# Patient Record
Sex: Female | Born: 1980 | Race: White | Hispanic: No | Marital: Married | Smoking: Never smoker
Health system: Southern US, Community
[De-identification: ages and names within clinical notes are randomized; demographics above are authoritative.]

## PROBLEM LIST (undated history)

## (undated) DIAGNOSIS — T4145XA Adverse effect of unspecified anesthetic, initial encounter: Secondary | ICD-10-CM

## (undated) DIAGNOSIS — T8859XA Other complications of anesthesia, initial encounter: Secondary | ICD-10-CM

## (undated) DIAGNOSIS — T7840XA Allergy, unspecified, initial encounter: Secondary | ICD-10-CM

## (undated) DIAGNOSIS — O4403 Placenta previa specified as without hemorrhage, third trimester: Secondary | ICD-10-CM

## (undated) DIAGNOSIS — R569 Unspecified convulsions: Secondary | ICD-10-CM

## (undated) DIAGNOSIS — Z98891 History of uterine scar from previous surgery: Secondary | ICD-10-CM

## (undated) DIAGNOSIS — O09519 Supervision of elderly primigravida, unspecified trimester: Secondary | ICD-10-CM

## (undated) DIAGNOSIS — R51 Headache: Secondary | ICD-10-CM

## (undated) DIAGNOSIS — R519 Headache, unspecified: Secondary | ICD-10-CM

## (undated) DIAGNOSIS — O403XX0 Polyhydramnios, third trimester, not applicable or unspecified: Secondary | ICD-10-CM

## (undated) DIAGNOSIS — L309 Dermatitis, unspecified: Secondary | ICD-10-CM

## (undated) HISTORY — DX: Other complications of anesthesia, initial encounter: T88.59XA

## (undated) HISTORY — PX: WISDOM TOOTH EXTRACTION: SHX21

## (undated) HISTORY — DX: Polyhydramnios, third trimester, not applicable or unspecified: O40.3XX0

## (undated) HISTORY — DX: Headache: R51

## (undated) HISTORY — DX: Headache, unspecified: R51.9

## (undated) HISTORY — DX: Allergy, unspecified, initial encounter: T78.40XA

## (undated) HISTORY — PX: CHOLECYSTECTOMY: SHX55

## (undated) HISTORY — DX: Dermatitis, unspecified: L30.9

## (undated) HISTORY — DX: Supervision of elderly primigravida, unspecified trimester: O09.519

## (undated) HISTORY — DX: Adverse effect of unspecified anesthetic, initial encounter: T41.45XA

---

## 2014-08-24 ENCOUNTER — Encounter: Payer: Self-pay | Admitting: Internal Medicine

## 2014-08-24 ENCOUNTER — Ambulatory Visit (INDEPENDENT_AMBULATORY_CARE_PROVIDER_SITE_OTHER): Payer: Commercial Managed Care - PPO | Admitting: Internal Medicine

## 2014-08-24 VITALS — Temp 98.0°F | Ht 68.0 in | Wt 138.0 lb

## 2014-08-24 DIAGNOSIS — L309 Dermatitis, unspecified: Secondary | ICD-10-CM | POA: Insufficient documentation

## 2014-08-24 DIAGNOSIS — B349 Viral infection, unspecified: Secondary | ICD-10-CM | POA: Insufficient documentation

## 2014-08-24 DIAGNOSIS — G40909 Epilepsy, unspecified, not intractable, without status epilepticus: Secondary | ICD-10-CM | POA: Insufficient documentation

## 2014-08-24 NOTE — Assessment & Plan Note (Addendum)
I discussed the different possible etiologies of this. Syphilis, HIV and [place] spotted fever certainly high however all those have been essentially negative. Other viruses certainly could cause this and leave a rash and persistent symptoms particularly with the sore throat, myalgias and arthralgias. Other possibility could be toxic shock but this would be a very mild case and though she was using tampons she has used one since and has had no issues with that.  Also strep or staph-related illness but is resolving regardless. I told her that it would be difficult to find any definitive diagnosis however is all she continues to get better no reason for further workup. I suspect her symptoms will persist for several weeks to months but should slowly get better. I did encourage her to maintain her activity to help facilitate improvement. I also suggested a multivitamin and protein for improvement. She was told to call if she has any different symptoms or concerns but otherwise can follow-up on a when necessary basis.  45 minutes spent with patient including 25 minutes of face-to-face contact of counseling and exam.

## 2014-08-24 NOTE — Progress Notes (Signed)
Subjective:    Patient ID: Amber Taylor, female    DOB: [birth year], 34 y.o.   MRN: [id number]  HPI She comes in here for evaluation as a new patient. She was noted to have a rash on her hands and soles. She is me history of starting symptoms about February of this year. She initially noted a sore throat and some chills and body aches and then developed the rash on her palms and soles of her feet several days later. She also noted some facial swelling and hives all along her body. She had been hiking in February but no known tick exposure. She was seen in the urgent care and thought to be hand-foot-and-mouth disease. Then she was seen in another urgent care and there is concern for Stevens-Johnson's and she was sent to an emergency room. She was managed supportively and thought it could be due to her Lamictal and that was stopped. She then began to develop some sloughing of her skin on her hands and feet. She did also have a seizure after being off her Lamictal and now is on Ativan. Her rash persisted and she was [facility] and other viruses. [facility] was notable for a positive IgG but negative IgM. Coxsackie was negative for IgM. She also tells me she was tested for syphilis and HIV which both were negative, though I don't have a copy of the report here. Other labs show a relatively normal WBC and hemoglobin. No electrolyte abnormalities. Her ALT was minimally elevated and TSH was normal. She also has some swelling of her hands and some joint aches and swelling of her hands. This did resolve. She also gives a history of tampon use and did have a tampon in when symptoms did start. She though recently has used a tampon again during her recent menstruation is had no problem with that. Her symptoms seem to be resolving though still has some sensitive skin on her hands after the her layer peeled off.  She still has persistent fatigue though seems to be slowly  improving.   She did have all of her childhood vaccines. She did have pictures to show me the rash at different stages.   Review of Systems  Constitutional: Positive for appetite change and fatigue. Negative for fever, chills and unexpected weight change.  HENT: Negative for sore throat and trouble swallowing.   Gastrointestinal: Negative for nausea, abdominal pain and diarrhea.  Musculoskeletal: Positive for myalgias and arthralgias.       Symptoms have nearly resolved but some persistent symptoms  Skin:       Rashes nearly resolved  Neurological: Positive for headaches. Negative for dizziness and light-headedness.       She did have some headaches during the time but has resolved  Psychiatric/Behavioral: The patient is not nervous/anxious.        Objective:   Physical Exam  Constitutional: She appears well-developed and well-nourished. No distress.  HENT:  Mouth/Throat: No oropharyngeal exudate.  Eyes: Right eye exhibits no discharge. Left eye exhibits no discharge. No scleral icterus.  Cardiovascular: Normal rate, regular rhythm and normal heart sounds.   No murmur heard. Pulmonary/Chest: Effort normal and breath sounds normal. No respiratory distress. She has no wheezes.  Lymphadenopathy:    She has no cervical adenopathy.  Skin:  Faint rash on her arms and resolving rash on her hands  Psychiatric: She has a normal mood and affect.  Assessment & Plan:

## 2015-03-25 ENCOUNTER — Ambulatory Visit (INDEPENDENT_AMBULATORY_CARE_PROVIDER_SITE_OTHER): Payer: BLUE CROSS/BLUE SHIELD | Admitting: Neurology

## 2015-03-25 DIAGNOSIS — J309 Allergic rhinitis, unspecified: Secondary | ICD-10-CM | POA: Diagnosis not present

## 2015-03-29 ENCOUNTER — Ambulatory Visit (INDEPENDENT_AMBULATORY_CARE_PROVIDER_SITE_OTHER): Payer: BLUE CROSS/BLUE SHIELD | Admitting: *Deleted

## 2015-03-29 DIAGNOSIS — J309 Allergic rhinitis, unspecified: Secondary | ICD-10-CM | POA: Diagnosis not present

## 2015-03-31 ENCOUNTER — Ambulatory Visit (INDEPENDENT_AMBULATORY_CARE_PROVIDER_SITE_OTHER): Payer: BLUE CROSS/BLUE SHIELD | Admitting: Neurology

## 2015-03-31 DIAGNOSIS — J309 Allergic rhinitis, unspecified: Secondary | ICD-10-CM | POA: Diagnosis not present

## 2015-04-08 ENCOUNTER — Ambulatory Visit (INDEPENDENT_AMBULATORY_CARE_PROVIDER_SITE_OTHER): Payer: BLUE CROSS/BLUE SHIELD

## 2015-04-08 DIAGNOSIS — J301 Allergic rhinitis due to pollen: Secondary | ICD-10-CM | POA: Diagnosis not present

## 2015-04-13 ENCOUNTER — Ambulatory Visit (INDEPENDENT_AMBULATORY_CARE_PROVIDER_SITE_OTHER): Payer: BLUE CROSS/BLUE SHIELD | Admitting: Neurology

## 2015-04-13 DIAGNOSIS — J309 Allergic rhinitis, unspecified: Secondary | ICD-10-CM | POA: Diagnosis not present

## 2015-04-19 ENCOUNTER — Ambulatory Visit (INDEPENDENT_AMBULATORY_CARE_PROVIDER_SITE_OTHER): Payer: BLUE CROSS/BLUE SHIELD | Admitting: *Deleted

## 2015-04-19 DIAGNOSIS — J309 Allergic rhinitis, unspecified: Secondary | ICD-10-CM | POA: Diagnosis not present

## 2015-04-25 ENCOUNTER — Ambulatory Visit (INDEPENDENT_AMBULATORY_CARE_PROVIDER_SITE_OTHER): Payer: BLUE CROSS/BLUE SHIELD

## 2015-04-25 DIAGNOSIS — J309 Allergic rhinitis, unspecified: Secondary | ICD-10-CM | POA: Diagnosis not present

## 2015-04-28 ENCOUNTER — Ambulatory Visit (INDEPENDENT_AMBULATORY_CARE_PROVIDER_SITE_OTHER): Payer: BLUE CROSS/BLUE SHIELD | Admitting: *Deleted

## 2015-04-28 DIAGNOSIS — J309 Allergic rhinitis, unspecified: Secondary | ICD-10-CM | POA: Diagnosis not present

## 2015-05-02 ENCOUNTER — Ambulatory Visit (INDEPENDENT_AMBULATORY_CARE_PROVIDER_SITE_OTHER): Payer: BLUE CROSS/BLUE SHIELD

## 2015-05-02 DIAGNOSIS — J309 Allergic rhinitis, unspecified: Secondary | ICD-10-CM | POA: Diagnosis not present

## 2015-05-04 ENCOUNTER — Ambulatory Visit (INDEPENDENT_AMBULATORY_CARE_PROVIDER_SITE_OTHER): Payer: BLUE CROSS/BLUE SHIELD

## 2015-05-04 DIAGNOSIS — J309 Allergic rhinitis, unspecified: Secondary | ICD-10-CM | POA: Diagnosis not present

## 2015-05-09 ENCOUNTER — Ambulatory Visit (INDEPENDENT_AMBULATORY_CARE_PROVIDER_SITE_OTHER): Payer: BLUE CROSS/BLUE SHIELD

## 2015-05-09 DIAGNOSIS — J309 Allergic rhinitis, unspecified: Secondary | ICD-10-CM | POA: Diagnosis not present

## 2015-05-11 ENCOUNTER — Ambulatory Visit (INDEPENDENT_AMBULATORY_CARE_PROVIDER_SITE_OTHER): Payer: BLUE CROSS/BLUE SHIELD | Admitting: *Deleted

## 2015-05-11 ENCOUNTER — Ambulatory Visit: Payer: Self-pay | Admitting: *Deleted

## 2015-05-11 DIAGNOSIS — J309 Allergic rhinitis, unspecified: Secondary | ICD-10-CM | POA: Diagnosis not present

## 2015-05-17 ENCOUNTER — Ambulatory Visit (INDEPENDENT_AMBULATORY_CARE_PROVIDER_SITE_OTHER): Payer: BLUE CROSS/BLUE SHIELD | Admitting: *Deleted

## 2015-05-17 DIAGNOSIS — J309 Allergic rhinitis, unspecified: Secondary | ICD-10-CM

## 2015-05-19 ENCOUNTER — Ambulatory Visit (INDEPENDENT_AMBULATORY_CARE_PROVIDER_SITE_OTHER): Payer: BLUE CROSS/BLUE SHIELD | Admitting: *Deleted

## 2015-05-19 DIAGNOSIS — J309 Allergic rhinitis, unspecified: Secondary | ICD-10-CM | POA: Diagnosis not present

## 2015-05-23 ENCOUNTER — Ambulatory Visit (INDEPENDENT_AMBULATORY_CARE_PROVIDER_SITE_OTHER): Payer: BLUE CROSS/BLUE SHIELD

## 2015-05-23 DIAGNOSIS — J309 Allergic rhinitis, unspecified: Secondary | ICD-10-CM | POA: Diagnosis not present

## 2015-05-30 ENCOUNTER — Ambulatory Visit (INDEPENDENT_AMBULATORY_CARE_PROVIDER_SITE_OTHER): Payer: BLUE CROSS/BLUE SHIELD

## 2015-05-30 DIAGNOSIS — J309 Allergic rhinitis, unspecified: Secondary | ICD-10-CM | POA: Diagnosis not present

## 2015-06-02 ENCOUNTER — Ambulatory Visit (INDEPENDENT_AMBULATORY_CARE_PROVIDER_SITE_OTHER): Payer: BLUE CROSS/BLUE SHIELD

## 2015-06-02 DIAGNOSIS — J309 Allergic rhinitis, unspecified: Secondary | ICD-10-CM

## 2015-06-07 ENCOUNTER — Ambulatory Visit (INDEPENDENT_AMBULATORY_CARE_PROVIDER_SITE_OTHER): Payer: BLUE CROSS/BLUE SHIELD

## 2015-06-07 DIAGNOSIS — J309 Allergic rhinitis, unspecified: Secondary | ICD-10-CM | POA: Diagnosis not present

## 2015-06-09 ENCOUNTER — Ambulatory Visit (INDEPENDENT_AMBULATORY_CARE_PROVIDER_SITE_OTHER): Payer: BLUE CROSS/BLUE SHIELD

## 2015-06-09 DIAGNOSIS — J309 Allergic rhinitis, unspecified: Secondary | ICD-10-CM | POA: Diagnosis not present

## 2015-06-12 NOTE — L&D Delivery Note (Addendum)
Delivery Note  Pt pushed for just under an hour and at 8:54 AM a viable female was delivered via Vaginal, Spontaneous Delivery (Presentation:OA ;  ).  APGAR: 8, 9; weight; pending  .   Placenta status:delivered shultz, intact , .  Cord:  with the following complications:none; nuchal x 1 reduced easily over infants head  Anesthesia:  epidural Episiotomy: None Lacerations: 2nd degree;Perineal Suture Repair: 2.0 vicryl rapide and 0 Est. Blood Loss (mL): 150  Mom to postpartum.  Baby to Couplet care / Skin to Skin.  Amber Taylor 04/28/2016, 9:29 AM

## 2015-06-14 ENCOUNTER — Ambulatory Visit (INDEPENDENT_AMBULATORY_CARE_PROVIDER_SITE_OTHER): Payer: BLUE CROSS/BLUE SHIELD

## 2015-06-14 DIAGNOSIS — J309 Allergic rhinitis, unspecified: Secondary | ICD-10-CM

## 2015-06-21 ENCOUNTER — Ambulatory Visit (INDEPENDENT_AMBULATORY_CARE_PROVIDER_SITE_OTHER): Payer: BLUE CROSS/BLUE SHIELD

## 2015-06-21 DIAGNOSIS — J309 Allergic rhinitis, unspecified: Secondary | ICD-10-CM

## 2015-07-11 ENCOUNTER — Ambulatory Visit (INDEPENDENT_AMBULATORY_CARE_PROVIDER_SITE_OTHER): Payer: BLUE CROSS/BLUE SHIELD

## 2015-07-11 DIAGNOSIS — J309 Allergic rhinitis, unspecified: Secondary | ICD-10-CM

## 2015-07-18 ENCOUNTER — Ambulatory Visit (INDEPENDENT_AMBULATORY_CARE_PROVIDER_SITE_OTHER): Payer: BLUE CROSS/BLUE SHIELD

## 2015-07-18 DIAGNOSIS — J309 Allergic rhinitis, unspecified: Secondary | ICD-10-CM

## 2015-07-27 ENCOUNTER — Ambulatory Visit (INDEPENDENT_AMBULATORY_CARE_PROVIDER_SITE_OTHER): Payer: BLUE CROSS/BLUE SHIELD

## 2015-07-27 DIAGNOSIS — J309 Allergic rhinitis, unspecified: Secondary | ICD-10-CM | POA: Diagnosis not present

## 2015-08-02 ENCOUNTER — Ambulatory Visit (INDEPENDENT_AMBULATORY_CARE_PROVIDER_SITE_OTHER): Payer: BLUE CROSS/BLUE SHIELD

## 2015-08-02 DIAGNOSIS — J309 Allergic rhinitis, unspecified: Secondary | ICD-10-CM

## 2015-08-10 DIAGNOSIS — J301 Allergic rhinitis due to pollen: Secondary | ICD-10-CM | POA: Diagnosis not present

## 2015-08-11 DIAGNOSIS — J3089 Other allergic rhinitis: Secondary | ICD-10-CM | POA: Diagnosis not present

## 2015-08-12 ENCOUNTER — Ambulatory Visit (INDEPENDENT_AMBULATORY_CARE_PROVIDER_SITE_OTHER): Payer: BLUE CROSS/BLUE SHIELD | Admitting: *Deleted

## 2015-08-12 DIAGNOSIS — J309 Allergic rhinitis, unspecified: Secondary | ICD-10-CM

## 2015-08-18 ENCOUNTER — Ambulatory Visit (INDEPENDENT_AMBULATORY_CARE_PROVIDER_SITE_OTHER): Payer: BLUE CROSS/BLUE SHIELD

## 2015-08-18 DIAGNOSIS — J309 Allergic rhinitis, unspecified: Secondary | ICD-10-CM

## 2015-08-23 ENCOUNTER — Ambulatory Visit (INDEPENDENT_AMBULATORY_CARE_PROVIDER_SITE_OTHER): Payer: BLUE CROSS/BLUE SHIELD

## 2015-08-23 DIAGNOSIS — J309 Allergic rhinitis, unspecified: Secondary | ICD-10-CM

## 2015-08-30 ENCOUNTER — Ambulatory Visit (INDEPENDENT_AMBULATORY_CARE_PROVIDER_SITE_OTHER): Payer: BLUE CROSS/BLUE SHIELD

## 2015-08-30 DIAGNOSIS — J309 Allergic rhinitis, unspecified: Secondary | ICD-10-CM

## 2015-09-13 ENCOUNTER — Telehealth: Payer: Self-pay | Admitting: *Deleted

## 2015-09-13 NOTE — Telephone Encounter (Signed)
Patient called and states she just found out she is pregnant and wants to know the plan with allergy injections. She is currently at Red #1 1:100. Her Grass-Tree last dose was 0.50, Cat-Dog - 0.50, Mite-Weed 0.40. Please advise.

## 2015-09-20 ENCOUNTER — Ambulatory Visit (INDEPENDENT_AMBULATORY_CARE_PROVIDER_SITE_OTHER): Payer: BLUE CROSS/BLUE SHIELD | Admitting: *Deleted

## 2015-09-20 DIAGNOSIS — J309 Allergic rhinitis, unspecified: Secondary | ICD-10-CM | POA: Diagnosis not present

## 2015-10-05 ENCOUNTER — Ambulatory Visit (INDEPENDENT_AMBULATORY_CARE_PROVIDER_SITE_OTHER): Payer: BLUE CROSS/BLUE SHIELD

## 2015-10-05 DIAGNOSIS — J309 Allergic rhinitis, unspecified: Secondary | ICD-10-CM

## 2015-10-12 ENCOUNTER — Ambulatory Visit (INDEPENDENT_AMBULATORY_CARE_PROVIDER_SITE_OTHER): Payer: BLUE CROSS/BLUE SHIELD | Admitting: *Deleted

## 2015-10-12 DIAGNOSIS — J309 Allergic rhinitis, unspecified: Secondary | ICD-10-CM

## 2015-10-13 LAB — OB RESULTS CONSOLE ABO/RH: RH Type: NEGATIVE

## 2015-10-13 LAB — OB RESULTS CONSOLE HEPATITIS B SURFACE ANTIGEN: Hepatitis B Surface Ag: NEGATIVE

## 2015-10-13 LAB — OB RESULTS CONSOLE RUBELLA ANTIBODY, IGM: Rubella: IMMUNE

## 2015-10-13 LAB — OB RESULTS CONSOLE HIV ANTIBODY (ROUTINE TESTING): HIV: NONREACTIVE

## 2015-10-13 LAB — OB RESULTS CONSOLE ANTIBODY SCREEN: Antibody Screen: NEGATIVE

## 2015-10-13 LAB — OB RESULTS CONSOLE GC/CHLAMYDIA
Chlamydia: NEGATIVE
GC PROBE AMP, GENITAL: NEGATIVE

## 2015-10-13 LAB — OB RESULTS CONSOLE RPR: RPR: NONREACTIVE

## 2015-11-01 ENCOUNTER — Ambulatory Visit (INDEPENDENT_AMBULATORY_CARE_PROVIDER_SITE_OTHER): Payer: BLUE CROSS/BLUE SHIELD

## 2015-11-01 DIAGNOSIS — J309 Allergic rhinitis, unspecified: Secondary | ICD-10-CM

## 2015-11-15 ENCOUNTER — Ambulatory Visit (INDEPENDENT_AMBULATORY_CARE_PROVIDER_SITE_OTHER): Payer: BLUE CROSS/BLUE SHIELD | Admitting: *Deleted

## 2015-11-15 DIAGNOSIS — J309 Allergic rhinitis, unspecified: Secondary | ICD-10-CM | POA: Diagnosis not present

## 2015-11-22 ENCOUNTER — Ambulatory Visit (INDEPENDENT_AMBULATORY_CARE_PROVIDER_SITE_OTHER): Payer: BLUE CROSS/BLUE SHIELD

## 2015-11-22 DIAGNOSIS — J309 Allergic rhinitis, unspecified: Secondary | ICD-10-CM | POA: Diagnosis not present

## 2015-12-06 ENCOUNTER — Ambulatory Visit (INDEPENDENT_AMBULATORY_CARE_PROVIDER_SITE_OTHER): Payer: BLUE CROSS/BLUE SHIELD | Admitting: *Deleted

## 2015-12-06 DIAGNOSIS — J309 Allergic rhinitis, unspecified: Secondary | ICD-10-CM

## 2015-12-20 ENCOUNTER — Encounter (HOSPITAL_COMMUNITY): Payer: Self-pay | Admitting: Obstetrics and Gynecology

## 2015-12-21 ENCOUNTER — Ambulatory Visit (INDEPENDENT_AMBULATORY_CARE_PROVIDER_SITE_OTHER): Payer: BLUE CROSS/BLUE SHIELD

## 2015-12-21 ENCOUNTER — Encounter (HOSPITAL_COMMUNITY): Payer: Self-pay | Admitting: Obstetrics and Gynecology

## 2015-12-21 DIAGNOSIS — J309 Allergic rhinitis, unspecified: Secondary | ICD-10-CM | POA: Diagnosis not present

## 2015-12-28 ENCOUNTER — Other Ambulatory Visit (HOSPITAL_COMMUNITY): Payer: Self-pay | Admitting: Obstetrics and Gynecology

## 2015-12-28 DIAGNOSIS — O359XX0 Maternal care for (suspected) fetal abnormality and damage, unspecified, not applicable or unspecified: Secondary | ICD-10-CM

## 2015-12-29 ENCOUNTER — Ambulatory Visit (INDEPENDENT_AMBULATORY_CARE_PROVIDER_SITE_OTHER): Payer: BLUE CROSS/BLUE SHIELD

## 2015-12-29 DIAGNOSIS — J309 Allergic rhinitis, unspecified: Secondary | ICD-10-CM | POA: Diagnosis not present

## 2015-12-30 ENCOUNTER — Ambulatory Visit (HOSPITAL_COMMUNITY)
Admission: RE | Admit: 2015-12-30 | Discharge: 2015-12-30 | Disposition: A | Discharge status: Home / Self Care | Payer: BLUE CROSS/BLUE SHIELD | Source: Ambulatory Visit | Attending: Obstetrics and Gynecology | Admitting: Obstetrics and Gynecology

## 2015-12-30 ENCOUNTER — Encounter (HOSPITAL_COMMUNITY): Payer: Self-pay

## 2015-12-30 DIAGNOSIS — Z3A20 20 weeks gestation of pregnancy: Secondary | ICD-10-CM | POA: Insufficient documentation

## 2015-12-30 DIAGNOSIS — O09519 Supervision of elderly primigravida, unspecified trimester: Secondary | ICD-10-CM

## 2015-12-30 DIAGNOSIS — O358XX0 Maternal care for other (suspected) fetal abnormality and damage, not applicable or unspecified: Secondary | ICD-10-CM | POA: Insufficient documentation

## 2015-12-30 DIAGNOSIS — O99352 Diseases of the nervous system complicating pregnancy, second trimester: Secondary | ICD-10-CM | POA: Insufficient documentation

## 2015-12-30 DIAGNOSIS — G40909 Epilepsy, unspecified, not intractable, without status epilepticus: Secondary | ICD-10-CM | POA: Diagnosis not present

## 2015-12-30 DIAGNOSIS — Z36 Encounter for antenatal screening of mother: Secondary | ICD-10-CM | POA: Insufficient documentation

## 2015-12-30 DIAGNOSIS — O09512 Supervision of elderly primigravida, second trimester: Secondary | ICD-10-CM | POA: Diagnosis not present

## 2015-12-30 DIAGNOSIS — O359XX0 Maternal care for (suspected) fetal abnormality and damage, unspecified, not applicable or unspecified: Secondary | ICD-10-CM

## 2015-12-30 HISTORY — DX: Unspecified convulsions: R56.9

## 2016-01-02 ENCOUNTER — Encounter (HOSPITAL_COMMUNITY): Payer: Self-pay

## 2016-01-03 DIAGNOSIS — O09519 Supervision of elderly primigravida, unspecified trimester: Secondary | ICD-10-CM | POA: Insufficient documentation

## 2016-01-03 NOTE — Progress Notes (Signed)
Genetic Counseling  High-Risk Gestation Note  Appointment Date:  12/30/15 Referred By: Edwinna Areola, * Date of Birth:  May 03, 1981 Partner:  Ignatius Specking   Pregnancy History: G1P0 Estimated Date of Delivery: 05/17/16 Estimated Gestational Age: [redacted]w[redacted]d Attending: Particia Nearing, MD   Amber Taylor and her husband, Mr. Dale Strausser, were seen for genetic counseling because of a maternal age of 35 y.o. and ultrasound finding of choroid plexus cyst.     In summary:  Discussed AMA and associated risk for fetal aneuploidy  Choroid plexus cyst on ultrasound today; complete results reported separately  Reviewed approximate 1 in 126 risk for Trisomy 20 given patient's age related risk  Discussed options for screening  NIPS-declined  Ultrasound-performed today  Discussed diagnostic testing options  Amniocentesis-declined  Reviewed family history concerns- patient has personal history of epilepsy  Discussed carrier screening options  CF-declined  SMA-declined  Hemoglobinopathies-declined  They were counseled regarding maternal age and the association with risk for chromosome conditions due to nondisjunction with aging of the ova.   We reviewed chromosomes, nondisjunction, and the associated 1 in 141 risk for fetal aneuploidy related to a maternal age of 35 y.o. at [redacted]w[redacted]d gestation. They were counseled that the risk for aneuploidy decreases as gestational age increases, accounting for those pregnancies which spontaneously abort.  We specifically discussed Down syndrome (trisomy 38), trisomies 51 and 27, and sex chromosome aneuploidies (47,XXX and 47,XXY) including the common features and prognoses of each.   Ultrasound performed today visualized choroid plexus cyst (CPC).  The ultrasound report will be sent under separate cover.  We discussed that the second trimester genetic sonogram is targeted at identifying features associated with aneuploidy.  It has evolved  as a screening tool used to provide an individualized risk assessment for Down syndrome and other trisomies.  The ability of sonography to aid in the detection of aneuploidies relies on identification of both major structural anomalies and "soft markers."  The patient was counseled that the latter term refers to findings that are often normal variants and do not cause any significant medical problems.  Nonetheless, these markers have a known association with aneuploidy.    They were counseled that the choroid plexus is an area in the brain where cerebral spinal fluid, the fluid that bathes the brain and spinal cord, is made.  Cysts, or fluid filled sacs, are sometimes found in the choroid plexus of babies both before and after they are born.  We discussed that approximately 1% of pregnancies evaluated by ultrasound will show choroid plexus cyst (CPCs).  Literature suggests that CPCs are an ultrasound finding in approximately 30-50% of fetuses with trisomy 18, but are an isolated finding in less than 10% of fetuses with trisomy 25.  Ms. Cuervo was counseled that when a patient has other risk factors for fetal trisomy 79 (abnormal First trimester or quad screening, advanced maternal age, or another ultrasound finding), CPCs are associated with an increased risk (LR of 9) for trisomy 63.  Newer literature suggests that in the absence of other risk factors, CPCs are likely a normal variation of development or a benign finding.  CPCs are not associated with an increased risk for fetal Down syndrome. Thus, given the patient's age and the ultrasound finding, the chance for fetal trisomy 97 would be approximately 1 in 68.   We reviewed additional available screening options including noninvasive prenatal screening (NIPS)/cell free DNA (cfDNA) screening. They were counseled that screening tests are used to modify a patient's  a priori risk for aneuploidy, typically based on age. This estimate provides a pregnancy  specific risk assessment. We reviewed the benefits and limitations of each option. Specifically, we discussed the conditions for which each test screens, the detection rates, and false positive rates of each. She was/They were also counseled regarding diagnostic testing via amniocentesis. We reviewed the approximate 1 in 300-500 risk for complications from amniocentesis, including spontaneous pregnancy loss. We discussed the possible results that the tests might provide including: positive, negative, unanticipated, and no result. Finally, they were counseled regarding the cost of each option and potential out of pocket expenses. After consideration of all the options, they declined NIPS and amniocentesis.  They understand that screening tests cannot rule out all birth defects or genetic syndromes. The patient was advised of this limitation and states she still does not want additional testing at this time.   Mrs. Abria Bronaugh was provided with written information regarding cystic fibrosis (CF), spinal muscular atrophy (SMA) and hemoglobinopathies including the carrier frequency, availability of carrier screening and prenatal diagnosis if indicated.  In addition, we discussed that CF and hemoglobinopathies are routinely screened for as part of the [place] newborn screening panel.  After further discussion, she declined screening for CF, SMA and hemoglobinopathies.  Both family histories were reviewed and found to be contributory for epilepsy for the patient of unknown etiology. The patient reported that she is taking lamictal for treatment during pregnancy and has had close follow-up with her neurologist throughout the pregnancy. Epilepsy occurs in approximately 1% of the population and can have many causes.  Approximately 80% of epilepsy is thought to be idiopathic while the remaining 20% is secondary to a variety of factors such as perinatal events, infections, trauma and genetic disease.  A specific diagnosis in  an affected individual is necessary to accurately assess the risk for other family members to develop epilepsy.  In the absence of a known etiology, epilepsy is thought to be caused by a combination of genetic and environmental factors, called multifactorial inheritance. Recurrence risk for epilepsy is estimated to be 4% for offspring of an individual with primary idiopathic epilepsy. It would be important for the couple's pediatrician to be aware of this history so that their child(ren) can be screened and followed appropriately. Without further information regarding the provided family history, an accurate genetic risk cannot be calculated. Further genetic counseling is warranted if more information is obtained.  Available data regarding prenatal use of Lamictal have not indicated an association with an increased risk for birth defects. Conflicting reports exist on whether or not its use is associated with an increased risk for oral clefting. Further studies are needed to elucidate potential associations with prenatal use of Lamictal.  Even though a limited number of medicines are known to cause birth defects, we cannot say that it is completely safe to use any medicines during pregnancy.  It is also not possible to predict any drug-drug interactions that occur, or how they might affect a pregnancy.  The use of medications is recommended in pregnancy only if the benefit to the mother (and thus the pregnancy) outweighs potential risks to the baby.  Sometimes the maternal use of medications, dictated by a medical condition, may even be more beneficial to a pregnancy than not taking the medication(s) at all. Mrs. Mykiah Maung denied exposure to environmental toxins or chemical agents. She denied the use of alcohol, tobacco or street drugs. She denied significant viral illnesses during the course of her pregnancy.  Her medical and surgical histories were otherwise noncontributory.   I counseled this couple  regarding the above risks and available options. Most of the counseling was provided by Janit Pagan, [employer] genetic counseling student, under my direct supervision. The approximate face-to-face time with the genetic counselor was 35 minutes.  Quinn Plowman, MS,  Certified [employer] 01/03/2016

## 2016-01-05 ENCOUNTER — Ambulatory Visit (INDEPENDENT_AMBULATORY_CARE_PROVIDER_SITE_OTHER): Payer: BLUE CROSS/BLUE SHIELD

## 2016-01-05 DIAGNOSIS — J309 Allergic rhinitis, unspecified: Secondary | ICD-10-CM | POA: Diagnosis not present

## 2016-01-06 ENCOUNTER — Encounter (HOSPITAL_COMMUNITY): Payer: Self-pay

## 2016-01-06 ENCOUNTER — Other Ambulatory Visit (HOSPITAL_COMMUNITY): Payer: Self-pay

## 2016-01-11 ENCOUNTER — Ambulatory Visit (INDEPENDENT_AMBULATORY_CARE_PROVIDER_SITE_OTHER): Payer: BLUE CROSS/BLUE SHIELD

## 2016-01-11 DIAGNOSIS — J309 Allergic rhinitis, unspecified: Secondary | ICD-10-CM | POA: Diagnosis not present

## 2016-02-07 ENCOUNTER — Ambulatory Visit (INDEPENDENT_AMBULATORY_CARE_PROVIDER_SITE_OTHER): Payer: BLUE CROSS/BLUE SHIELD | Admitting: *Deleted

## 2016-02-07 DIAGNOSIS — J309 Allergic rhinitis, unspecified: Secondary | ICD-10-CM | POA: Diagnosis not present

## 2016-02-21 ENCOUNTER — Ambulatory Visit (INDEPENDENT_AMBULATORY_CARE_PROVIDER_SITE_OTHER): Payer: BLUE CROSS/BLUE SHIELD | Admitting: *Deleted

## 2016-02-21 DIAGNOSIS — J309 Allergic rhinitis, unspecified: Secondary | ICD-10-CM | POA: Diagnosis not present

## 2016-02-28 ENCOUNTER — Ambulatory Visit (INDEPENDENT_AMBULATORY_CARE_PROVIDER_SITE_OTHER): Payer: BLUE CROSS/BLUE SHIELD

## 2016-02-28 DIAGNOSIS — J309 Allergic rhinitis, unspecified: Secondary | ICD-10-CM

## 2016-04-13 ENCOUNTER — Telehealth (HOSPITAL_COMMUNITY): Payer: Self-pay | Admitting: *Deleted

## 2016-04-13 ENCOUNTER — Encounter (HOSPITAL_COMMUNITY): Payer: Self-pay | Admitting: *Deleted

## 2016-04-13 NOTE — Telephone Encounter (Signed)
Preadmission screen  

## 2016-04-16 LAB — OB RESULTS CONSOLE GBS: STREP GROUP B AG: NEGATIVE

## 2016-04-20 DIAGNOSIS — O403XX0 Polyhydramnios, third trimester, not applicable or unspecified: Secondary | ICD-10-CM

## 2016-04-20 HISTORY — DX: Polyhydramnios, third trimester, not applicable or unspecified: O40.3XX0

## 2016-04-27 ENCOUNTER — Encounter (HOSPITAL_COMMUNITY): Payer: Self-pay

## 2016-04-27 ENCOUNTER — Inpatient Hospital Stay (HOSPITAL_COMMUNITY)
Admission: AD | Admit: 2016-04-27 | Discharge: 2016-05-01 | DRG: 775 | Disposition: A | Discharge status: Home / Self Care | Payer: BLUE CROSS/BLUE SHIELD | Source: Ambulatory Visit | Attending: Obstetrics and Gynecology | Admitting: Obstetrics and Gynecology

## 2016-04-27 ENCOUNTER — Inpatient Hospital Stay (HOSPITAL_COMMUNITY)
Admission: RE | Admit: 2016-04-27 | Discharge: 2016-04-27 | Disposition: A | Discharge status: Home / Self Care | Payer: BLUE CROSS/BLUE SHIELD | Source: Ambulatory Visit | Attending: Obstetrics and Gynecology | Admitting: Obstetrics and Gynecology

## 2016-04-27 DIAGNOSIS — O99354 Diseases of the nervous system complicating childbirth: Secondary | ICD-10-CM | POA: Diagnosis present

## 2016-04-27 DIAGNOSIS — Z3490 Encounter for supervision of normal pregnancy, unspecified, unspecified trimester: Secondary | ICD-10-CM

## 2016-04-27 DIAGNOSIS — G40909 Epilepsy, unspecified, not intractable, without status epilepticus: Secondary | ICD-10-CM | POA: Diagnosis present

## 2016-04-27 DIAGNOSIS — O403XX0 Polyhydramnios, third trimester, not applicable or unspecified: Secondary | ICD-10-CM | POA: Diagnosis present

## 2016-04-27 DIAGNOSIS — Z3A37 37 weeks gestation of pregnancy: Secondary | ICD-10-CM | POA: Diagnosis not present

## 2016-04-27 LAB — CBC
HEMATOCRIT: 30.6 % — AB (ref 36.0–46.0)
HEMOGLOBIN: 10.3 g/dL — AB (ref 12.0–15.0)
MCH: 30.8 pg (ref 26.0–34.0)
MCHC: 33.7 g/dL (ref 30.0–36.0)
MCV: 91.6 fL (ref 78.0–100.0)
Platelets: 225 10*3/uL (ref 150–400)
RBC: 3.34 MIL/uL — AB (ref 3.87–5.11)
RDW: 13.1 % (ref 11.5–15.5)
WBC: 9.1 10*3/uL (ref 4.0–10.5)

## 2016-04-27 LAB — RPR: RPR Ser Ql: NONREACTIVE

## 2016-04-27 MED ORDER — OXYTOCIN 40 UNITS IN LACTATED RINGERS INFUSION - SIMPLE MED
1.0000 m[IU]/min | INTRAVENOUS | Status: DC
  Administered 2016-04-27: 2 m[IU]/min via INTRAVENOUS

## 2016-04-27 MED ORDER — LIDOCAINE HCL (PF) 1 % IJ SOLN
30.0000 mL | INTRAMUSCULAR | Status: DC | PRN
  Filled 2016-04-27: qty 30

## 2016-04-27 MED ORDER — FENTANYL CITRATE (PF) 100 MCG/2ML IJ SOLN
50.0000 ug | INTRAMUSCULAR | Status: DC | PRN
  Filled 2016-04-27: qty 2

## 2016-04-27 MED ORDER — TERBUTALINE SULFATE 1 MG/ML IJ SOLN
0.2500 mg | Freq: Once | INTRAMUSCULAR | Status: DC | PRN
  Filled 2016-04-27: qty 1

## 2016-04-27 MED ORDER — TERBUTALINE SULFATE 1 MG/ML IJ SOLN
0.2500 mg | Freq: Once | INTRAMUSCULAR | Status: DC | PRN
Start: 2016-04-27 — End: 2016-04-28
  Filled 2016-04-27: qty 1

## 2016-04-27 MED ORDER — ACETAMINOPHEN 325 MG PO TABS: 650.0000 mg | ORAL_TABLET | ORAL | Status: DC | PRN

## 2016-04-27 MED ORDER — LACTATED RINGERS IV SOLN: 500.0000 mL | INTRAVENOUS | Status: DC | PRN

## 2016-04-27 MED ORDER — LACTATED RINGERS IV SOLN
INTRAVENOUS | Status: DC
  Administered 2016-04-27 (×3): via INTRAVENOUS

## 2016-04-27 MED ORDER — OXYTOCIN BOLUS FROM INFUSION
500.0000 mL | Freq: Once | INTRAVENOUS | Status: AC
  Administered 2016-04-28: 500 mL via INTRAVENOUS

## 2016-04-27 MED ORDER — FLEET ENEMA 7-19 GM/118ML RE ENEM: 1.0000 | ENEMA | RECTAL | Status: DC | PRN

## 2016-04-27 MED ORDER — OXYCODONE-ACETAMINOPHEN 5-325 MG PO TABS: 2.0000 | ORAL_TABLET | ORAL | Status: DC | PRN

## 2016-04-27 MED ORDER — OXYTOCIN 40 UNITS IN LACTATED RINGERS INFUSION - SIMPLE MED
2.5000 [IU]/h | INTRAVENOUS | Status: DC
  Filled 2016-04-27: qty 1000

## 2016-04-27 MED ORDER — OXYCODONE-ACETAMINOPHEN 5-325 MG PO TABS: 1.0000 | ORAL_TABLET | ORAL | Status: DC | PRN

## 2016-04-27 MED ORDER — ONDANSETRON HCL 4 MG/2ML IJ SOLN
4.0000 mg | Freq: Four times a day (QID) | INTRAMUSCULAR | Status: DC | PRN
  Administered 2016-04-28: 4 mg via INTRAVENOUS
  Filled 2016-04-27: qty 2

## 2016-04-27 MED ORDER — MISOPROSTOL 25 MCG QUARTER TABLET
25.0000 ug | ORAL_TABLET | ORAL | Status: DC
  Administered 2016-04-27: 25 ug via VAGINAL
  Filled 2016-04-27: qty 0.25
  Filled 2016-04-27: qty 1

## 2016-04-27 MED ORDER — SOD CITRATE-CITRIC ACID 500-334 MG/5ML PO SOLN: 30.0000 mL | ORAL | Status: DC | PRN

## 2016-04-27 NOTE — Progress Notes (Signed)
Dr Mindi SlickerBanga in to see patient. Verbal order to leave Pitocin at 2 mU/min for the time being.  Lyndee Herbst KillbuckBrown Lawrance Wiedemann, [place] 04/27/2016 5:56 PM

## 2016-04-27 NOTE — Progress Notes (Signed)
Patient ID: Amber MurrayHeidi Hataway, female   DOB: Feb 08, 1981, 35 y.o.   MRN: [id number] Pt doing well. No complaints. Foley bulb still in place. Tolerating well.  If still in place in 1-2 hours will consider starting pitocin as well if able Continue expectant mgmt Cat 1 strip

## 2016-04-27 NOTE — Anesthesia Pain Management Evaluation Note (Signed)
  CRNA Pain Management Visit Note  Patient: Amber MurrayHeidi Taylor, 35 y.o., female  "Hello I am a member of the anesthesia team at [facility]. We have an anesthesia team available at all times to provide care throughout the hospital, including epidural management and anesthesia for C-section. I don't know your plan for the delivery whether it a natural birth, water birth, IV sedation, nitrous supplementation, doula or epidural, but we want to meet your pain goals."   1.Was your pain managed to your expectations on prior hospitalizations?   No prior hospitalizations  2.What is your expectation for pain management during this hospitalization?     Epidural  3.How can we help you reach that goal? epidural  Record the patient's initial score and the patient's pain goal.   Pain: 3  Pain Goal: 5 The [facility] wants you to be able to say your pain was always managed very well.  Sullivan Blasing 04/27/2016

## 2016-04-27 NOTE — H&P (Addendum)
Amber MurrayHeidi Taylor is a 35 y.o. female presenting for scheduled iol due to recurrent seizures in pregnancy. Pt had been stable with seizures prior to pregnancy. . Dating is confirmed with first trimester US. She had to triple her lamictal dose as well as take double her short release doses in the last 4-6weeks  due to recurrent and evolving seizures ( no aura ). Pt also developed polyhydramnios. Discussed her care with her neurologist and MFM and offered iol at 37 weeks due to neurologist concerns. At this time she reports some low back discomfort but tolerating contractions well, no VB or LOF.  OB History    Gravida Para Term Preterm AB Living   1             SAB TAB Ectopic Multiple Live Births                 Past Medical History:  Diagnosis Date  . Allergy   . AMA (advanced maternal age) primigravida 35+   . Complication of anesthesia    neurologist concerned about general anesthesia if needed  . Eczema   . Headache   . Seizures (HCC)    Past Surgical History:  Procedure Laterality Date  . CHOLECYSTECTOMY    . WISDOM TOOTH EXTRACTION     Family History: family history includes Cancer in her mother. Social History:  reports that she has never smoked. She has never used smokeless tobacco. She reports that she drinks alcohol. She reports that she does not use drugs.     Maternal Diabetes: No Genetic Screening: Normal Maternal Ultrasounds/Referrals: Normal Fetal Ultrasounds or other Referrals:  None Maternal Substance Abuse:  No Significant Maternal Medications:  Meds include: Other: lamictal xr 600bid and lamictal ir 200q 6 and prn Significant Maternal Lab Results:  Lab values include: Group B Strep negative Other Comments:  None  Review of Systems  Constitutional: Negative for chills, fever, malaise/fatigue and weight loss.  Eyes: Negative for blurred vision and double vision.  Respiratory: Negative for shortness of breath.   Cardiovascular: Negative for chest pain and leg  swelling.  Gastrointestinal: Negative for abdominal pain, heartburn, nausea and vomiting.  Genitourinary: Negative for dysuria.  Musculoskeletal: Positive for back pain.  Skin: Negative for itching and rash.  Neurological: Negative for dizziness, tingling, tremors, sensory change, speech change, focal weakness, seizures, weakness and headaches.  Endo/Heme/Allergies: Does not bruise/bleed easily.  Psychiatric/Behavioral: Negative for depression, hallucinations, substance abuse and suicidal ideas. The patient is nervous/anxious.    Maternal Medical History:  Reason for admission: Nausea. iol for medical reasons ( recurrent seizures in pregnancy)  Contractions: Onset was 1-2 hours ago.   Frequency: regular.   Perceived severity is mild.    Fetal activity: Perceived fetal activity is normal.   Last perceived fetal movement was within the past hour.    Prenatal complications: seizures  Prenatal Complications - Diabetes: none.    Dilation: 1 Effacement (%): 80 Station: Ballotable Exam by:: banga Blood pressure 112/71, pulse 86, temperature 97.8 F (36.6 C), temperature source Oral, resp. rate 18, height 5' 7.5" (1.715 m), weight 178 lb (80.7 kg), last menstrual period 08/11/2015. Maternal Exam:  Uterine Assessment: Contraction strength is mild.  Contraction frequency is regular.   Abdomen: Patient reports generalized tenderness.  Estimated fetal weight is AGA.   Fetal presentation: vertex  Introitus: Normal vulva. Normal vagina.  Pelvis: adequate for delivery.   Cervix: Cervix evaluated by digital exam.     Physical Exam  Constitutional: She is  oriented to person, place, and time. She appears well-developed and well-nourished.  HENT:  Head: Normocephalic.  Neck: Normal range of motion.  Cardiovascular: Normal rate.   Respiratory: Effort normal.  GI: Soft. There is generalized tenderness.  Genitourinary: Vagina normal and uterus normal.  Musculoskeletal: Normal range of  motion. She exhibits no edema.  Neurological: She is alert and oriented to person, place, and time.  Skin: Skin is warm and dry.  Psychiatric: She has a normal mood and affect. Her behavior is normal. Judgment and thought content normal.    Prenatal labs: ABO, Rh: --/--/AB NEG (11/17 0139) Antibody: POS (11/17 0139) Rubella: Immune (05/04 0000) RPR: Nonreactive (05/04 0000)  HBsAg: Negative (05/04 0000)  HIV: Non-reactive (05/04 0000)  GBS: Negative (11/06 0000)   Assessment/Plan: Prime at 37 1/[redacted]wks gestation with seizure disorder and polyhydramnious for iol S/p cytotec x 1 Will place foley bulb now as contracting too often for another cytotec Pain control prn ( will offer low dose benzo per neurology) Anticipate svd   Sharol Givenecilia Worema Banga 04/27/2016, 8:49 AM

## 2016-04-27 NOTE — Progress Notes (Signed)
Pt feeling dizzy and having blurry vision since starting Pitocin at 1830. BP WNL at 114/75. P 97. Pitocin currently on 752mU/min.   Pt requesting to speak with Dr Mindi SlickerBanga regarding Pitocin.   Dr Mindi SlickerBanga called to room.  Deveney Bayon FrancisBrown Maelee Hoot, [place] 04/27/2016 5:49 PM

## 2016-04-27 NOTE — Progress Notes (Signed)
Patient ID: Amber MurrayHeidi Taylor, female   DOB: [birth year], 35 y.o.   MRN: [id number] Foley bulb still in place.  Slight bloody show noted Pt has been ambulating Discussed starting on pitocin to augment contractions as better spaced.  Pt complained of blurry vision shortly after pitocin started; wondered if could eat to help ( something other than clear diet) ; advised would recommend stopping pitocin if did that then restart as per anesthesia.  Pt admits symptoms not as severe; ok to wait. If worsen will notify us and then would stop pitocin Will keep pitocin at 2mus and not increase Also discussed AROM for augmentation and implications ( eg would no longer be able to use foley bulb) EFM- cat 1 TOCO - contractions q 3mins

## 2016-04-27 NOTE — Progress Notes (Signed)
Patient ID: Amber MurrayHeidi Eichorn, female   DOB: 03-20-1981, 35 y.o.   MRN: [id number] Pt reports contractions at 5/10 Foley bulb out  4/70/-2 AROM performed with moderate return of clear fluid Pain control prn Anticipate svd Pit at 2mus

## 2016-04-27 NOTE — Progress Notes (Signed)
Patient ID: Amber MurrayHeidi Soltis, female   DOB: 02/07/1981, 35 y.o.   MRN: [id number]   Cooks foley bulb placed 60u/40v - pt tolerated well Recheck in 2-4 hours or prn

## 2016-04-28 ENCOUNTER — Inpatient Hospital Stay (HOSPITAL_COMMUNITY): Payer: BLUE CROSS/BLUE SHIELD | Admitting: Anesthesiology

## 2016-04-28 ENCOUNTER — Encounter (HOSPITAL_COMMUNITY): Payer: Self-pay | Admitting: Obstetrics and Gynecology

## 2016-04-28 MED ORDER — ACETAMINOPHEN 325 MG PO TABS
650.0000 mg | ORAL_TABLET | ORAL | Status: DC | PRN
  Administered 2016-04-28 – 2016-04-30 (×2): 650 mg via ORAL
  Filled 2016-04-28 (×2): qty 2

## 2016-04-28 MED ORDER — LIDOCAINE-EPINEPHRINE (PF) 2 %-1:200000 IJ SOLN
INTRAMUSCULAR | Status: DC | PRN
  Administered 2016-04-28: 3 mL

## 2016-04-28 MED ORDER — COCONUT OIL OIL
1.0000 "application " | TOPICAL_OIL | Status: DC | PRN
  Filled 2016-04-28: qty 120

## 2016-04-28 MED ORDER — LAMOTRIGINE 200 MG PO TABS: 200.0000 mg | ORAL_TABLET | Freq: Two times a day (BID) | ORAL | Status: DC

## 2016-04-28 MED ORDER — BUPIVACAINE HCL (PF) 0.25 % IJ SOLN
INTRAMUSCULAR | Status: DC | PRN
  Administered 2016-04-28 (×2): 4 mL via EPIDURAL

## 2016-04-28 MED ORDER — IBUPROFEN 600 MG PO TABS
600.0000 mg | ORAL_TABLET | Freq: Four times a day (QID) | ORAL | Status: DC
  Administered 2016-04-28 – 2016-05-01 (×12): 600 mg via ORAL
  Filled 2016-04-28 (×13): qty 1

## 2016-04-28 MED ORDER — SIMETHICONE 80 MG PO CHEW: 80.0000 mg | CHEWABLE_TABLET | ORAL | Status: DC | PRN

## 2016-04-28 MED ORDER — SENNOSIDES-DOCUSATE SODIUM 8.6-50 MG PO TABS
2.0000 | ORAL_TABLET | ORAL | Status: DC
  Administered 2016-04-28 – 2016-04-30 (×3): 2 via ORAL
  Filled 2016-04-28 (×3): qty 2

## 2016-04-28 MED ORDER — BENZOCAINE-MENTHOL 20-0.5 % EX AERO
1.0000 "application " | INHALATION_SPRAY | CUTANEOUS | Status: DC | PRN
  Filled 2016-04-28: qty 56

## 2016-04-28 MED ORDER — PHENYLEPHRINE 40 MCG/ML (10ML) SYRINGE FOR IV PUSH (FOR BLOOD PRESSURE SUPPORT)
80.0000 ug | PREFILLED_SYRINGE | INTRAVENOUS | Status: DC | PRN
  Filled 2016-04-28: qty 10
  Filled 2016-04-28: qty 5

## 2016-04-28 MED ORDER — DIBUCAINE 1 % RE OINT: 1.0000 "application " | TOPICAL_OINTMENT | RECTAL | Status: DC | PRN

## 2016-04-28 MED ORDER — ONDANSETRON HCL 4 MG/2ML IJ SOLN: 4.0000 mg | INTRAMUSCULAR | Status: DC | PRN

## 2016-04-28 MED ORDER — FERROUS SULFATE 325 (65 FE) MG PO TABS
325.0000 mg | ORAL_TABLET | Freq: Two times a day (BID) | ORAL | Status: DC
  Administered 2016-04-29 – 2016-05-01 (×5): 325 mg via ORAL
  Filled 2016-04-28 (×6): qty 1

## 2016-04-28 MED ORDER — PRENATAL MULTIVITAMIN CH
1.0000 | ORAL_TABLET | Freq: Every day | ORAL | Status: DC
  Administered 2016-04-28 – 2016-05-01 (×4): 1 via ORAL
  Filled 2016-04-28 (×4): qty 1

## 2016-04-28 MED ORDER — LAMOTRIGINE ER 200 MG PO TB24
200.0000 mg | ORAL_TABLET | Freq: Two times a day (BID) | ORAL | Status: DC
  Administered 2016-04-28 – 2016-04-29 (×2): 200 mg via ORAL

## 2016-04-28 MED ORDER — FENTANYL 2.5 MCG/ML BUPIVACAINE 1/10 % EPIDURAL INFUSION (WH - ANES)
14.0000 mL/h | INTRAMUSCULAR | Status: DC | PRN
  Administered 2016-04-28: 12 mL/h via EPIDURAL
  Filled 2016-04-28: qty 100

## 2016-04-28 MED ORDER — TETANUS-DIPHTH-ACELL PERTUSSIS 5-2.5-18.5 LF-MCG/0.5 IM SUSP: 0.5000 mL | Freq: Once | INTRAMUSCULAR | Status: DC

## 2016-04-28 MED ORDER — LACOSAMIDE 200 MG PO TABS
200.0000 mg | ORAL_TABLET | Freq: Two times a day (BID) | ORAL | Status: DC
Start: 2016-04-28 — End: 2016-05-01
  Administered 2016-04-28 – 2016-05-01 (×6): 200 mg via ORAL
  Filled 2016-04-28 (×6): qty 1

## 2016-04-28 MED ORDER — DIPHENHYDRAMINE HCL 25 MG PO CAPS: 25.0000 mg | ORAL_CAPSULE | Freq: Four times a day (QID) | ORAL | Status: DC | PRN

## 2016-04-28 MED ORDER — EPHEDRINE 5 MG/ML INJ
10.0000 mg | INTRAVENOUS | Status: DC | PRN
  Filled 2016-04-28: qty 4

## 2016-04-28 MED ORDER — LACTATED RINGERS IV SOLN: 500.0000 mL | Freq: Once | INTRAVENOUS | Status: DC

## 2016-04-28 MED ORDER — DIPHENHYDRAMINE HCL 50 MG/ML IJ SOLN: 12.5000 mg | INTRAMUSCULAR | Status: DC | PRN

## 2016-04-28 MED ORDER — PHENYLEPHRINE 40 MCG/ML (10ML) SYRINGE FOR IV PUSH (FOR BLOOD PRESSURE SUPPORT)
80.0000 ug | PREFILLED_SYRINGE | INTRAVENOUS | Status: DC | PRN
  Filled 2016-04-28: qty 5

## 2016-04-28 MED ORDER — WITCH HAZEL-GLYCERIN EX PADS: 1.0000 "application " | MEDICATED_PAD | CUTANEOUS | Status: DC | PRN

## 2016-04-28 MED ORDER — ONDANSETRON HCL 4 MG PO TABS: 4.0000 mg | ORAL_TABLET | ORAL | Status: DC | PRN

## 2016-04-28 NOTE — Progress Notes (Signed)
Patient ID: Amber MurrayHeidi Taylor, female   DOB: 1980/11/17, 35 y.o.   MRN: [id number] Pt complete and +2 ; labored down for past hour.  Has no complaints Will begin pushing Anticipate svd

## 2016-04-28 NOTE — Anesthesia Procedure Notes (Signed)
Epidural Patient location during procedure: OB  Staffing Anesthesiologist: Val EagleMOSER, Lasean Gorniak Performed: anesthesiologist   Preanesthetic Checklist Completed: patient identified, surgical consent, pre-op evaluation, timeout performed, IV checked, risks and benefits discussed and monitors and equipment checked  Epidural Patient position: sitting Prep: DuraPrep Patient monitoring: heart rate, cardiac monitor, continuous pulse ox and blood pressure Approach: midline Location: L3-L4 Injection technique: LOR saline  Needle:  Needle type: Tuohy  Needle gauge: 17 G Needle length: 9 cm Needle insertion depth: 6 cm Catheter type: closed end flexible Catheter size: 19 Gauge Catheter at skin depth: 12 cm Test dose: negative and 2% lidocaine with Epi 1:200 K  Assessment Events: blood not aspirated, injection not painful, no injection resistance, negative IV test and no paresthesia  Additional Notes Reason for block:procedure for pain

## 2016-04-28 NOTE — Anesthesia Preprocedure Evaluation (Signed)
Anesthesia Evaluation   Patient awake    Reviewed: Allergy & Precautions, Patient's Chart, lab work & pertinent test results  History of Anesthesia Complications Negative for: history of anesthetic complications  Airway Mallampati: II  TM Distance: >3 FB Neck ROM: Full    Dental  (+) Teeth Intact   Pulmonary neg pulmonary ROS,    breath sounds clear to auscultation       Cardiovascular negative cardio ROS   Rhythm:Regular     Neuro/Psych  Headaches, Seizures -,  negative psych ROS   GI/Hepatic negative GI ROS, Neg liver ROS,   Endo/Other  negative endocrine ROS  Renal/GU negative Renal ROS     Musculoskeletal   Abdominal   Peds  Hematology  (+) anemia ,   Anesthesia Other Findings   Reproductive/Obstetrics                             Anesthesia Physical Anesthesia Plan  ASA: II  Anesthesia Plan: Epidural   Post-op Pain Management:    Induction:   Airway Management Planned:   Additional Equipment:   Intra-op Plan:   Post-operative Plan:   Informed Consent: I have reviewed the patients History and Physical, chart, labs and discussed the procedure including the risks, benefits and alternatives for the proposed anesthesia with the patient or authorized representative who has indicated his/her understanding and acceptance.     Plan Discussed with: Anesthesiologist  Anesthesia Plan Comments:         Anesthesia Quick Evaluation

## 2016-04-28 NOTE — Anesthesia Postprocedure Evaluation (Signed)
Anesthesia Post Note  Patient: Amber MurrayHeidi Hoelzer  Procedure(s) Performed: * No procedures listed *  Patient location during evaluation: Mother Baby Anesthesia Type: Epidural Level of consciousness: awake and alert Pain management: satisfactory to patient Vital Signs Assessment: post-procedure vital signs reviewed and stable Respiratory status: respiratory function stable Cardiovascular status: stable Postop Assessment: no headache, no backache, epidural receding, patient able to bend at knees, no signs of nausea or vomiting and adequate PO intake Anesthetic complications: no     Last Vitals:  Vitals:   04/28/16 1200 04/28/16 1530  BP: 104/61 (!) 106/55  Pulse: 79 83  Resp: 18 18  Temp: 36.6 C 36.9 C    Last Pain:  Vitals:   04/28/16 2055  TempSrc:   PainSc: 3    Pain Goal: Patients Stated Pain Goal: 2 (04/28/16 1805)               Karleen DolphinFUSSELL,Cressida Milford

## 2016-04-29 LAB — CBC
HEMATOCRIT: 25.1 % — AB (ref 36.0–46.0)
HEMOGLOBIN: 8.6 g/dL — AB (ref 12.0–15.0)
MCH: 31.5 pg (ref 26.0–34.0)
MCHC: 34.3 g/dL (ref 30.0–36.0)
MCV: 91.9 fL (ref 78.0–100.0)
Platelets: 163 10*3/uL (ref 150–400)
RBC: 2.73 MIL/uL — AB (ref 3.87–5.11)
RDW: 13.5 % (ref 11.5–15.5)
WBC: 11.3 10*3/uL — AB (ref 4.0–10.5)

## 2016-04-29 MED ORDER — CYANOCOBALAMIN 500 MCG PO TABS
500.0000 ug | ORAL_TABLET | Freq: Every day | ORAL | Status: DC
  Administered 2016-04-30 – 2016-05-01 (×2): 500 ug via ORAL
  Filled 2016-04-29 (×3): qty 1

## 2016-04-29 MED ORDER — LAMOTRIGINE ER 200 MG PO TB24: 400.0000 mg | ORAL_TABLET | Freq: Two times a day (BID) | ORAL | Status: DC

## 2016-04-29 MED ORDER — VITAMIN D3 25 MCG (1000 UNIT) PO TABS
1000.0000 [IU] | ORAL_TABLET | Freq: Every day | ORAL | Status: DC
  Administered 2016-04-30 – 2016-05-01 (×2): 1000 [IU] via ORAL
  Filled 2016-04-29 (×3): qty 1

## 2016-04-29 MED ORDER — LAMOTRIGINE ER 200 MG PO TB24
400.0000 mg | ORAL_TABLET | Freq: Two times a day (BID) | ORAL | Status: DC
  Administered 2016-04-29 – 2016-05-01 (×4): 400 mg via ORAL

## 2016-04-29 MED ORDER — LORAZEPAM 1 MG PO TABS
0.5000 mg | ORAL_TABLET | ORAL | Status: DC | PRN
  Administered 2016-04-29: 0.5 mg via ORAL
  Filled 2016-04-29: qty 1

## 2016-04-29 MED ORDER — RHO D IMMUNE GLOBULIN 1500 UNIT/2ML IJ SOSY
300.0000 ug | PREFILLED_SYRINGE | Freq: Once | INTRAMUSCULAR | Status: AC
  Administered 2016-04-29: 300 ug via INTRAVENOUS
  Filled 2016-04-29: qty 2

## 2016-04-29 MED ORDER — LORAZEPAM 2 MG/ML IJ SOLN: 0.5000 mg | INTRAMUSCULAR | Status: DC | PRN

## 2016-04-29 NOTE — Progress Notes (Signed)
Pt just had another grand mal seizure after RN medicated with orders from Dr. Mindi SlickerBanga. Rapid response RN and House coverage RN called. Dr. Mindi SlickerBanga returning to hospital now.

## 2016-04-29 NOTE — Progress Notes (Signed)
When entering pt's room pt laying in bed awake. Patient's husband sitting on edge of  bed with patient. Patient's husband stated " she had a seizure about 15 minutes ago". Patient and family did not notify staff until this time. Pt drowsy but able to follow commands and answer questions. Pt oriented to self, but not place or time. Vital signs noted. No injuries noted from seizure. Seizure precautions taken. Rapid response called. Dr. Mindi SlickerBanga notified. No new orders given. MD in route to hospital to see pt at bedside.

## 2016-04-29 NOTE — Lactation Note (Signed)
This note was copied from a baby's chart. Lactation Consultation Note: Baby now 6630 hours old. Has been very sleepy at the breast and not nursing well. Mom easily able to hand express Colostrum. Baby would lick Colostrum off, very few sucks. Did suck on my gloved finger. Assisted mom with DEBP- reviewed setup, use and cleaning of pump pieces. Mom pumping as I left room. To spoon fed any Colostrum obtained to baby. Mom reports she feels comfortable with spoon feeding. BF brochure given. reviewed our phone number, OP appointments and BFSG as resources for support after DC. To call for assist prn   Patient Name: Amber Alison MurrayHeidi Taylor YNWGN'FToday's Date: 04/29/2016 Reason for consult: Initial assessment;Other (Comment) (37.2 Amber Taylor) Mom on seizure meds, had seizure during pragnancy   Maternal Data Formula Feeding for Exclusion: No Has patient been taught Hand Expression?: Yes Does the patient have breastfeeding experience prior to this delivery?: No  Feeding Feeding Type: Breast Fed Length of feed: 5 min  LATCH Score/Interventions Latch: Repeated attempts needed to sustain latch, nipple held in mouth throughout feeding, stimulation needed to elicit sucking reflex.  Audible Swallowing: None  Type of Nipple: Everted at rest and after stimulation  Comfort (Breast/Nipple): Soft / non-tender     Hold (Positioning): Assistance needed to correctly position infant at breast and maintain latch. Intervention(s): Breastfeeding basics reviewed  LATCH Score: 6  Lactation Tools Discussed/Used WIC Program: No Pump Review: Setup, frequency, and cleaning Initiated by:: Dw Date initiated:: 04/29/16   Consult Status Consult Status: Follow-up Date: 04/30/16 Follow-up type: In-patient    Amber HoitWeeks, Amber Taylor D 04/29/2016, 4:14 PM

## 2016-04-29 NOTE — Progress Notes (Signed)
Patient ID: Amber MurrayHeidi Taylor, female   DOB: 1981/01/07, 35 y.o.   MRN: [id number] Discussed circumcision in depth with pt and answered all questions. R/B/A reviewed and consent verified  Planned on doing circumcision however per peds baby still having some feeding issues so advised to wait till tomorrow to reassess.

## 2016-04-29 NOTE — Progress Notes (Addendum)
Post Partum Day 1 Subjective: Pt appeared to have recovered well from seizure. She was given 0.5mg  ativan after pumping and shortly afterwards ( within seconds to minutes) the nurse was called back into the room by husband as pt whad begun another seizure. Rapid response and house coverage were called and I was also notified. On arrival pt is stable but has post icteral. She is again aware and oriented to place and person. Lamictal level still pending. No apparent injury; cushioned bedrails have been up   Of note per husband pt has never had recurrent seizures in last year and a half  Objective: Blood pressure (!) 113/53, pulse (!) 114, temperature 98.4 F (36.9 C), temperature source Oral, resp. rate 18, height 5' 7.5" (1.715 m), weight 178 lb (80.7 kg), last menstrual period 08/11/2015, SpO2 94 %, unknown if currently breastfeeding.  Physical Exam:  General: cooperative, icteric and no distress Lochia: appropriate DVT Evaluation: Negative Homan's sign.   Recent Labs  04/27/16 0139 04/29/16 0537  HGB 10.3* 8.6*  HCT 30.6* 25.1*    Assessment/Plan: PPD#1 with recurrent seizures in short time frame- now stable Pt is due to pm dose of lamictal XR - will increase to 400mg  ( from 200mg ); dose to be given now.   Will assess pt response to this dose and lamictal level  once available  Will transfer pt to third floor for closer monitoring  Will keep vimpat at 200mg  bid and lamictal IR at 200mg  bid as well with 100-200mg  prn as previously dosed.     LOS: 2 days   Sharol GivenCecilia Worema Banga 04/29/2016, 9:28 PM

## 2016-04-29 NOTE — Progress Notes (Signed)
Patient ID: Amber MurrayHeidi Taylor, female   DOB: [birth year], 35 y.o.   MRN: [id number] Pt was visiting with family when had a seizure. Per family they quickly removed baby from her arms and her father ( physician) and her husband supported her airway. Seizure was reported to have lasted about two minutes.  Pt's nurse was unaware of this and was informed about 10-6415minutes later when she went in to perform routine assessment. She immediately called the rapid response team. Pt was declared to be stable with some mild post ictal memory deficiencies that resolved. I was then notified and came to hospital.   Pt appears to have recovered quite well per my assessment: she is alert and oriented to person, place and time and details of her care. She recalls "not feeling well" - symptom similar to aura she would get prepregnancy prior to seizure.  Pt's neurologist, Dr Noberto RetortBorenstein, was contacted first by family then my me on arrival at hospital  Agree pt would benefit from increased rest: baby to be taken to nursery for the rest of the evening.  Pt encouraged to notify us of any unusual/concerning symptoms  Plan as follows:  Lamictal level obtained stat at this time  Continue on baseline of 200mg  lamictal XR bid ( next dose due at 2200) and vimpat 200mg  bid  Lamictal IR to be dosed bid as well and also intermittently based on pt symptoms ( range 100-200mg  prn) - pt comfortable with self dosing and prefers   Ativan 0.5mg  po or im prn

## 2016-04-30 LAB — RH IG WORKUP (INCLUDES ABO/RH)
ABO/RH(D): AB NEG
FETAL SCREEN: NEGATIVE
Gestational Age(Wks): 37.2
Unit division: 0

## 2016-04-30 MED ORDER — LAMOTRIGINE 100 MG PO TABS
100.0000 mg | ORAL_TABLET | Freq: Four times a day (QID) | ORAL | Status: DC | PRN
  Filled 2016-04-30: qty 1

## 2016-04-30 NOTE — Progress Notes (Signed)
Patient ID: Amber MurrayHeidi Reim, female   DOB: June 05, 1981, 35 y.o.   MRN: [id number] CTSP for lamictal dosing.   Patient states that prior to admission she was up to 1800mg  po daily of lamictal.   She has had instruction from neurologist  to take lamictal 100mg  po prn as soon as she gets an aura and needs to have at bedside so does not have to wait on RN to get med.    Lamictal level pending  Pharmacy notified and will send pre-packaged lamictal for pt to keep at bedside and prn order placed in computer.  Pt understands to notify nurse immediately if takes tablet.

## 2016-04-30 NOTE — Progress Notes (Signed)
[facility] pt stated that Dr. Mindi SlickerBanga permitted her to keep Lamictal IR at bedside in the event she feels like she is going to have a seizure. She states that this am, shortly after taking her lamictal ER (1018am), she felt an aura coming on, so she took the lamictal IR. Dr. Senaida Oresichardson notified. Sheryn BisonGordon, Gift Rueckert Warner

## 2016-04-30 NOTE — Progress Notes (Signed)
Post Partum Day 2 Subjective: no complaints.  Pt slept well and had no further seizures overnight.  Feels good this morning  Objective: Blood pressure 113/73, pulse 82, temperature 98.4 F (36.9 C), temperature source Oral, resp. rate 18, height 5' 7.5" (1.715 m), weight 80.7 kg (178 lb), last menstrual period 08/11/2015, SpO2 99 %, unknown if currently breastfeeding.  Physical Exam:  General: alert and cooperative Lochia: appropriate Uterine Fundus: firm    Recent Labs  04/29/16 0537  HGB 8.6*  HCT 25.1*    Assessment/Plan:  Dr. Mindi SlickerBanga discussed pt with Dr. Noberto RetortBorenstein and he recommends continuing Lamictal ER 400mg  po BID for now.  Pt aware of symptoms of toxicity of vertigo and nausea and feels she will recognize them if occurring, continue other meds as previous Will allow pt to got to regular room as husband to stay with her at all times Plan d/c in AM if can remain seizure free next 24 hours on new dosing Advised pt to call and set up f/u appt with neuro today D/w pt circumcision and they desire to proceed.  Plan for discharge tomorrow   LOS: 3 days   Kaylaann Mountz W 04/30/2016, 8:25 AM

## 2016-04-30 NOTE — Progress Notes (Signed)
[facility]t a second episode of seizure activity. Pt appears to recovered from the seizure however, pt appears tired, free of pain or discomfort and remained oriented x 4. Pt and spouse were oriented to room, call bell in close reach . Pt was assisted to bathroom x 1 and then settled comfortable to bed. We will continue to monitor.

## 2016-04-30 NOTE — Progress Notes (Signed)
Called into patient room around 2150 by FOB stating "pt is having another seizure".  Pt was on her side and was foaming at the mouth when entering the room. Pt in bed at its lowest position with seizure pads in place. FOB states patient did not hit her head on anything. Seizure seemed to last about 3 minutes. Took patient about 10 minutes after seizure to become oriented x 4. Pt appears to be lethargic. Vitals signs WNL. No c/o pain. Dr. Mindi SlickerBanga, house coverage and rapid response notified.  Ordered to transfer patient to AICU. [facility] and patient transferred as ordered.

## 2016-04-30 NOTE — Progress Notes (Signed)
UR chart review completed.  

## 2016-04-30 NOTE — Lactation Note (Signed)
This note was copied from a baby's chart. Lactation Consultation Note Follow up visit at 56 hours of age.  Mom is taking Lamictal for seizures and listed as and L2 per Amber Taylor's "Medications & Mother's Milk" Baby has had 5 feedings with 2 breast feedings and supplements of 2-3815mls.  Mom reports a recent feeding attempt of about 5 minutes and last feeding prior to that was 7am. LC assisted with latching in cross cradle hold on left breast.  Baby is not showing interest in feeding and when latched only takes a few sucks and released nipple.   LC assisted with spoon feeding of about 5mls of colostrum.  Baby spoon fed well with extended tongue noted to have heart shaped tip.   LC attempted re-latching and baby was sleepy at breast.  Mom reports very painful  Latch with few attempts.  LC fit mom with #20 nipple shield, mom is able to return demonstration of nipple shield application.   Mom independently latched baby with audible gulping noted. LC tugged on chin and lower lip to widen latch.  Baby maintained feeding with audible swallows for 15 mintues.   Moms breasts are filling and massaged softer during feeding.  Due to poor feeding history mom will wake baby as needed for feedings, use NS and post pump to soften breasts or 15 minutes on initiate phase.  Mom to supplement all EBM back to baby.  Instructions on syringe feeding at the breast discussed and mom will call out to RN for assist when needed.         Patient Name: Amber Alison MurrayHeidi Taylor ZOXWR'UToday's Date: 04/30/2016 Reason for consult: Follow-up assessment;Difficult latch   Maternal Data Has patient been taught Hand Expression?: Yes Does the patient have breastfeeding experience prior to this delivery?: Yes  Feeding Feeding Type: Breast Fed Length of feed: 15 min  LATCH Score/Interventions Latch: Grasps breast easily, tongue down, lips flanged, rhythmical sucking. Intervention(s): Waking techniques Intervention(s): Breast massage;Breast  compression  Audible Swallowing: Spontaneous and intermittent Intervention(s): Skin to skin;Hand expression  Type of Nipple: Everted at rest and after stimulation  Comfort (Breast/Nipple): Soft / non-tender     Hold (Positioning): Assistance needed to correctly position infant at breast and maintain latch. Intervention(s): Breastfeeding basics reviewed;Support Pillows;Position options;Skin to skin  LATCH Score: 9  Lactation Tools Discussed/Used Tools: Nipple Shields Nipple shield size: 20   Consult Status Consult Status: Follow-up Date: 05/01/16 Follow-up type: In-patient    Amber Taylor, Amber MerlesJana Taylor 04/30/2016, 5:35 PM

## 2016-05-01 LAB — TYPE AND SCREEN
ABO/RH(D): AB NEG
Antibody Screen: POSITIVE
DAT, IgG: NEGATIVE
UNIT DIVISION: 0
UNIT DIVISION: 0

## 2016-05-01 LAB — LAMOTRIGINE LEVEL: LAMOTRIGINE LVL: 4.4 ug/mL (ref 2.0–20.0)

## 2016-05-01 MED ORDER — IBUPROFEN 600 MG PO TABS: 600.0000 mg | ORAL_TABLET | Freq: Four times a day (QID) | ORAL | 0 refills | Status: DC | PRN

## 2016-05-01 MED ORDER — PRENATAL MULTIVITAMIN CH: 1.0000 | ORAL_TABLET | Freq: Every day | ORAL | 3 refills | Status: AC

## 2016-05-01 NOTE — Discharge Summary (Signed)
OB Discharge Summary     Patient Name: Amber MurrayHeidi Taylor DOB: 08/03/1980 MRN: [id number]  Date of admission: 04/27/2016 Delivering MD: Pryor OchoaBANGA, CECILIA [facility]   Date of discharge: 05/01/2016  Admitting diagnosis: Induction Intrauterine pregnancy: 708w2d     Secondary diagnosis:  Active Problems:   Pregnancy   SVD (spontaneous vaginal delivery)   Postpartum care following vaginal delivery  Additional problems: seizure disorder     Discharge diagnosis: Term Pregnancy Delivered, seizures                                                                                                Post partum procedures:none  Augmentation: AROM, Cytotec and Foley Balloon  Complications: None  Hospital course:  Induction of Labor With Vaginal Delivery   35 y.o. yo G1P1001 at 278w2d was admitted to the hospital 04/27/2016 for induction of labor.  Indication for induction: recurrent seizures.  Patient had an uncomplicated labor course as follows: Membrane Rupture Time/Date: 9:47 PM ,04/27/2016   Intrapartum Procedures: Episiotomy: None [1]                                         Lacerations:  2nd degree [3];Perineal [11]  Patient had delivery of a Viable infant.  Information for the patient's newborn:  Amber Taylor, Amber Taylor [[id number]][[id number]]  Delivery Method: Vag-Spont   04/28/2016  Details of delivery can be found in separate delivery note.  Patient had a routine postpartum course. Patient is discharged home 05/01/16.   Physical exam Vitals:   04/30/16 1200 04/30/16 1917 04/30/16 2215 05/01/16 0537  BP: 103/65 (!) 98/56 109/60 (!) 112/57  Pulse: 69 81 80 62  Resp: 18 18 18 18   Temp: 97.5 F (36.4 C) 98.1 F (36.7 C) 97.8 F (36.6 C) 97.7 F (36.5 C)  TempSrc: Oral Oral Oral Oral  SpO2:      Weight:      Height:       General: alert and no distress Lochia: appropriate Uterine Fundus: firm  Labs: Lab Results  Component Value Date   WBC 11.3 (H) 04/29/2016   HGB 8.6 (L) 04/29/2016   HCT  25.1 (L) 04/29/2016   MCV 91.9 04/29/2016   PLT 163 04/29/2016   No flowsheet data found.  Discharge instruction: per After Visit Summary and "Baby and Me Booklet".  After visit meds:    Medication List    TAKE these medications   B-12 PO Take 1 tablet by mouth daily.   ibuprofen 600 MG tablet Commonly known as:  ADVIL,MOTRIN Take 1 tablet (600 mg total) by mouth every 6 (six) hours as needed.   IRON PO Take 1 tablet by mouth daily.   lamoTRIgine 100 MG tablet Commonly known as:  LAMICTAL Take 100 mg by mouth daily as needed (seizures).   LAMICTAL XR 200 MG Tb24 Generic drug:  LamoTRIgine XR Take 3 tablets by mouth 2 (two) times daily. Take 600mg  at 10am and 10pm   LAMICTAL 100 MG tablet Generic drug:  lamoTRIgine  Take 300 mg by mouth 2 (two) times daily. 300mg  at 4am and 4pm   prenatal multivitamin Tabs tablet Take 1 tablet by mouth daily at 12 noon.   VIMPAT 200 MG Tabs tablet Generic drug:  lacosamide Take 200 mg by mouth 2 (two) times daily.   VITAMIN D PO Take 1 tablet by mouth daily.       Diet: routine diet  Activity: Advance as tolerated. Pelvic rest for 6 weeks.   Outpatient follow up:6 weeks Follow up Appt:No future appointments. Follow up Visit:No Follow-up on file.  Postpartum contraception: Condoms  Newborn Data: Live born female  Birth Weight: 7 lb 5.5 oz (3330 g) APGAR: 8, 9  Baby Feeding: Breast Disposition:home with mother   05/01/2016 Amber Taylor, Amber Jesson, MD

## 2016-05-01 NOTE — Progress Notes (Signed)
Post Partum Day 3 Subjective: no complaints, up ad lib, tolerating PO and nl lochia, pain controlled.  No seizures in greater than 24 hr  Objective: Blood pressure (!) 112/57, pulse 62, temperature 97.7 F (36.5 C), temperature source Oral, resp. rate 18, height 5' 7.5" (1.715 m), weight 80.7 kg (178 lb), last menstrual period 08/11/2015, SpO2 99 %, unknown if currently breastfeeding.  Physical Exam:  General: alert and no distress Lochia: appropriate Uterine Fundus: firm   Recent Labs  04/29/16 0537  HGB 8.6*  HCT 25.1*    Assessment/Plan: Discharge home, Breastfeeding and Lactation consult.  Routine care.  F/U with neurology.  D/C with Motrin and PNV.  F/u 6 weeks   LOS: 4 days   Bovard-Stuckert, Babbie Dondlinger 05/01/2016, 7:57 AM

## 2016-05-01 NOTE — Lactation Note (Signed)
This note was copied from a baby's chart. Lactation Consultation Note  Patient Name: Amber Alison MurrayHeidi Lochridge Today's Date: 05/01/2016  follow up visit made prior to discharge.  Mom states baby just finished a feeding and cluster fed during the night.  She is using a 20 mm nipple shield for feeds.  Mom has a good amount of easily expressible transitional milk.Marland Kitchen. Discharge instructions given including engorgement treatment.  Questions answered.  Outpatient lactation services and support information reviewed and encouraged.   Maternal Data    Feeding    LATCH Score/Interventions                      Lactation Tools Discussed/Used     Consult Status      Huston FoleyMOULDEN, Dara Beidleman S 05/01/2016, 9:21 AM

## 2016-05-16 ENCOUNTER — Encounter (HOSPITAL_COMMUNITY): Payer: Self-pay | Admitting: Obstetrics and Gynecology

## 2016-05-25 ENCOUNTER — Encounter (HOSPITAL_COMMUNITY): Payer: Self-pay | Admitting: Obstetrics and Gynecology

## 2016-06-26 NOTE — Addendum Note (Signed)
Addended by: Berna BueWHITAKER, CARRIE L on: 06/26/2016 04:47 PM   Modules accepted: Orders

## 2017-08-23 LAB — OB RESULTS CONSOLE ABO/RH: RH TYPE: NEGATIVE

## 2017-08-23 LAB — OB RESULTS CONSOLE RPR: RPR: NONREACTIVE

## 2017-08-23 LAB — OB RESULTS CONSOLE GC/CHLAMYDIA
CHLAMYDIA, DNA PROBE: NEGATIVE
GC PROBE AMP, GENITAL: NEGATIVE

## 2017-08-23 LAB — OB RESULTS CONSOLE ANTIBODY SCREEN: Antibody Screen: NEGATIVE

## 2017-08-23 LAB — OB RESULTS CONSOLE HIV ANTIBODY (ROUTINE TESTING): HIV: NONREACTIVE

## 2017-08-23 LAB — OB RESULTS CONSOLE HEPATITIS B SURFACE ANTIGEN: Hepatitis B Surface Ag: NEGATIVE

## 2017-08-23 LAB — OB RESULTS CONSOLE RUBELLA ANTIBODY, IGM: Rubella: IMMUNE

## 2017-10-15 ENCOUNTER — Other Ambulatory Visit (HOSPITAL_COMMUNITY): Payer: Self-pay | Admitting: Obstetrics and Gynecology

## 2017-10-15 DIAGNOSIS — Z3689 Encounter for other specified antenatal screening: Secondary | ICD-10-CM

## 2017-10-15 DIAGNOSIS — O99352 Diseases of the nervous system complicating pregnancy, second trimester: Secondary | ICD-10-CM

## 2017-10-15 DIAGNOSIS — Z3A19 19 weeks gestation of pregnancy: Secondary | ICD-10-CM

## 2017-10-15 DIAGNOSIS — G40909 Epilepsy, unspecified, not intractable, without status epilepticus: Secondary | ICD-10-CM

## 2017-10-28 ENCOUNTER — Encounter (HOSPITAL_COMMUNITY): Payer: Self-pay | Admitting: *Deleted

## 2017-10-29 ENCOUNTER — Other Ambulatory Visit (HOSPITAL_COMMUNITY): Payer: Self-pay | Admitting: Obstetrics and Gynecology

## 2017-10-29 ENCOUNTER — Other Ambulatory Visit (HOSPITAL_COMMUNITY): Payer: Self-pay | Admitting: *Deleted

## 2017-10-29 ENCOUNTER — Encounter (HOSPITAL_COMMUNITY): Payer: Self-pay

## 2017-10-29 ENCOUNTER — Ambulatory Visit (HOSPITAL_COMMUNITY)
Admission: RE | Admit: 2017-10-29 | Discharge: 2017-10-29 | Disposition: A | Discharge status: Home / Self Care | Payer: Commercial Managed Care - PPO | Source: Ambulatory Visit | Attending: Obstetrics and Gynecology | Admitting: Obstetrics and Gynecology

## 2017-10-29 DIAGNOSIS — O4402 Placenta previa specified as without hemorrhage, second trimester: Secondary | ICD-10-CM

## 2017-10-29 DIAGNOSIS — Z3A19 19 weeks gestation of pregnancy: Secondary | ICD-10-CM | POA: Insufficient documentation

## 2017-10-29 DIAGNOSIS — Z3689 Encounter for other specified antenatal screening: Secondary | ICD-10-CM

## 2017-10-29 DIAGNOSIS — O99352 Diseases of the nervous system complicating pregnancy, second trimester: Secondary | ICD-10-CM | POA: Insufficient documentation

## 2017-10-29 DIAGNOSIS — O4400 Placenta previa specified as without hemorrhage, unspecified trimester: Secondary | ICD-10-CM

## 2017-10-29 DIAGNOSIS — G40909 Epilepsy, unspecified, not intractable, without status epilepticus: Secondary | ICD-10-CM | POA: Insufficient documentation

## 2017-10-29 DIAGNOSIS — O09522 Supervision of elderly multigravida, second trimester: Secondary | ICD-10-CM

## 2017-11-15 ENCOUNTER — Encounter (HOSPITAL_COMMUNITY): Payer: Self-pay

## 2017-11-15 ENCOUNTER — Ambulatory Visit (HOSPITAL_COMMUNITY)
Admission: RE | Admit: 2017-11-15 | Discharge: 2017-11-15 | Disposition: A | Discharge status: Home / Self Care | Payer: Self-pay | Source: Ambulatory Visit | Attending: Obstetrics and Gynecology | Admitting: Obstetrics and Gynecology

## 2017-11-15 DIAGNOSIS — Z79899 Other long term (current) drug therapy: Secondary | ICD-10-CM | POA: Insufficient documentation

## 2017-11-15 DIAGNOSIS — Z888 Allergy status to other drugs, medicaments and biological substances status: Secondary | ICD-10-CM | POA: Insufficient documentation

## 2017-11-15 DIAGNOSIS — Z3A23 23 weeks gestation of pregnancy: Secondary | ICD-10-CM | POA: Insufficient documentation

## 2017-11-15 DIAGNOSIS — O99352 Diseases of the nervous system complicating pregnancy, second trimester: Secondary | ICD-10-CM | POA: Insufficient documentation

## 2017-11-15 DIAGNOSIS — O09522 Supervision of elderly multigravida, second trimester: Secondary | ICD-10-CM | POA: Insufficient documentation

## 2017-11-15 DIAGNOSIS — G40909 Epilepsy, unspecified, not intractable, without status epilepticus: Secondary | ICD-10-CM | POA: Insufficient documentation

## 2017-11-15 DIAGNOSIS — O4402 Placenta previa specified as without hemorrhage, second trimester: Secondary | ICD-10-CM | POA: Insufficient documentation

## 2017-11-26 NOTE — Consult Note (Signed)
Maternal Fetal Medicine Consultation  I had the pleasure of seeing your patient Amber Taylor for Maternal-Fetal Medicine consultation on 11/15/2017. As you know, Amber Taylor is a 37 y.o. G2P1001 at [redacted]w[redacted]d who presents for consultation regarding seizure disorder and placenta previa.  Amber Taylor is followed by Dr. Terance Hart of [facility] for a longstanding seizure disorder. She reports that outside of pregnancy her seizure disorder is very well controlled and that she only has seizures in pregnancy. To date this pregnancy she has had at least three generalized seizures, one involving head trauma. Her antiepileptic medications have been uptitrated consistently throughout the pregnancy. Currently she takes Lamictal XL 400mg  AM, 800 mg QHS, short-acting Lamictal 50mg  QHS, and Vimpat BID. Her OB's office is monitoring Lamictal levels which have all been subtherapeutic (goal > 10). In her first pregnancy Pyper also experience recurrent seizures and was delivered at 37 weeks per her neurologist's recommendation. She also experienced a seizure on post-partum day 1.   This pregnancy has also been notable for treatment for a peritonsillar abscess with antibiotics and aspiration, as well as placenta previa seen on anatomy ultrasound without vaginal bleeding.   Amber Taylor feels well today. She denies contractions, loss of fluid, or vaginal bleeding. Fetal movement is active.   The remainder of Amber Taylor's medical history and surgical history are otherwise unremarkable. She takes prenatal vitamins, folic acid, Lamictal and Vimpat and is allergic to prednisone. Ameisha denies alcohol, tobacco or other drug use. Her family history is non-contributory.  We discussed the following issues during her visit today:  1. Seizure disorder: I discussed with Amber Taylor that pregnancy has an unpredictable and variable influence on epilepsy. Seizure frequency can increase, remain stable, or decrease. Seizure recurrence can often be attributed to  difficulty maintaining adequate treatment levels with antiepileptic drugs during pregnancy given the changes in drug metabolism and volume of distribution. In general, epilepsy should be treated with the smallest effective medication dosing. Monotherapy is preferable to polytherapy. However, Amber Taylor's seizure disorder is clearly extremely difficult to control in pregnancy despite being well controlled outside of pregnancy. Therefore, I recommend continued titration of her antiepileptic regimen as needed to decrease the frequency of her seizures. Rogene has a very good relationship with her neurologist Dr. Terance Hart. However, given the severity of her seizure disorder in pregnancy I discussed the possibility of consultation for a second opinion with an epilepsy specialist at one of the academic medical centers in the area. Amber Taylor will consider this.   [name] reports her neurologist wanted to start her on a new medication without much pregnancy data. She is unable to recall the name of the medication. I am happy to discuss any available safety data in pregnancy once [name] can determine the name of this medication.   Additionally, we also discussed the importance of maintaining appropriate sleep quantity throughout pregnancy and in the postpartum period, as sleep deficit is a common trigger for seizure recurrence. Amber Taylor has many life changes and stressors currently that may also be contributing to her increased seizure frequency.   2. Placenta previa: I reviewed the finding of placenta previa in detail as well as recommendations for pelvic rest and bleeding precautions. We reviewed a plan to repeat an ultrasound around 28 weeks to reassess placental location. Amber Taylor understands she should call your office or present to the MAU immediately with any vaginal bleeding. We discussed that cesarean delivery is indicated in the event of a persistent placenta previa, but that there is a good chance the previa may resolve  later  in pregnancy.   Amber Taylor had questions about the possibility of continuing co-management with our practice. I discussed with her that as of July 2019, we will no longer be seeing patients at the [facility]. However, we will continue to be available for consultation at our offices in [place].  Thank you for the opportunity to be a part of the care of Amber Taylor. Please contact our office if we can be of further assistance.   I spent approximately 60 minutes with this patient with over 50% of time spent in face-to-face counseling.  Darlyn ReadEmily Bunce, MD Maternal-Fetal Medicine

## 2017-12-24 ENCOUNTER — Ambulatory Visit (HOSPITAL_COMMUNITY)
Admission: RE | Admit: 2017-12-24 | Discharge: 2017-12-24 | Disposition: A | Discharge status: Home / Self Care | Payer: Self-pay | Source: Ambulatory Visit | Attending: Obstetrics and Gynecology | Admitting: Obstetrics and Gynecology

## 2017-12-24 ENCOUNTER — Other Ambulatory Visit (HOSPITAL_COMMUNITY): Payer: Self-pay | Admitting: Maternal & Fetal Medicine

## 2017-12-24 ENCOUNTER — Encounter (HOSPITAL_COMMUNITY): Payer: Self-pay

## 2017-12-24 ENCOUNTER — Other Ambulatory Visit (HOSPITAL_COMMUNITY): Payer: Self-pay | Admitting: *Deleted

## 2017-12-24 DIAGNOSIS — G40909 Epilepsy, unspecified, not intractable, without status epilepticus: Secondary | ICD-10-CM

## 2017-12-24 DIAGNOSIS — O09522 Supervision of elderly multigravida, second trimester: Secondary | ICD-10-CM

## 2017-12-24 DIAGNOSIS — O99352 Diseases of the nervous system complicating pregnancy, second trimester: Secondary | ICD-10-CM

## 2017-12-24 DIAGNOSIS — Z3A27 27 weeks gestation of pregnancy: Secondary | ICD-10-CM | POA: Insufficient documentation

## 2017-12-24 DIAGNOSIS — O4400 Placenta previa specified as without hemorrhage, unspecified trimester: Secondary | ICD-10-CM

## 2017-12-24 DIAGNOSIS — Z362 Encounter for other antenatal screening follow-up: Secondary | ICD-10-CM

## 2017-12-24 DIAGNOSIS — O4402 Placenta previa specified as without hemorrhage, second trimester: Secondary | ICD-10-CM | POA: Insufficient documentation

## 2017-12-24 DIAGNOSIS — Z3686 Encounter for antenatal screening for cervical length: Secondary | ICD-10-CM

## 2018-01-28 ENCOUNTER — Other Ambulatory Visit (HOSPITAL_COMMUNITY): Payer: Self-pay | Admitting: Obstetrics and Gynecology

## 2018-01-28 ENCOUNTER — Other Ambulatory Visit (HOSPITAL_COMMUNITY): Payer: Self-pay | Admitting: *Deleted

## 2018-01-28 ENCOUNTER — Ambulatory Visit (HOSPITAL_COMMUNITY)
Admission: RE | Admit: 2018-01-28 | Discharge: 2018-01-28 | Disposition: A | Discharge status: Home / Self Care | Payer: Self-pay | Source: Ambulatory Visit | Attending: Obstetrics and Gynecology | Admitting: Obstetrics and Gynecology

## 2018-01-28 ENCOUNTER — Encounter (HOSPITAL_COMMUNITY): Payer: Self-pay

## 2018-01-28 DIAGNOSIS — Z3A32 32 weeks gestation of pregnancy: Secondary | ICD-10-CM | POA: Insufficient documentation

## 2018-01-28 DIAGNOSIS — O09523 Supervision of elderly multigravida, third trimester: Secondary | ICD-10-CM | POA: Insufficient documentation

## 2018-01-28 DIAGNOSIS — Z880 Allergy status to penicillin: Secondary | ICD-10-CM | POA: Insufficient documentation

## 2018-01-28 DIAGNOSIS — Z79899 Other long term (current) drug therapy: Secondary | ICD-10-CM | POA: Insufficient documentation

## 2018-01-28 DIAGNOSIS — O99353 Diseases of the nervous system complicating pregnancy, third trimester: Secondary | ICD-10-CM | POA: Insufficient documentation

## 2018-01-28 DIAGNOSIS — O4400 Placenta previa specified as without hemorrhage, unspecified trimester: Secondary | ICD-10-CM

## 2018-01-28 DIAGNOSIS — Z362 Encounter for other antenatal screening follow-up: Secondary | ICD-10-CM

## 2018-01-28 DIAGNOSIS — O4403 Placenta previa specified as without hemorrhage, third trimester: Secondary | ICD-10-CM | POA: Insufficient documentation

## 2018-01-28 DIAGNOSIS — G40909 Epilepsy, unspecified, not intractable, without status epilepticus: Secondary | ICD-10-CM | POA: Insufficient documentation

## 2018-01-28 DIAGNOSIS — Z3686 Encounter for antenatal screening for cervical length: Secondary | ICD-10-CM

## 2018-01-28 HISTORY — DX: Complete placenta previa nos or without hemorrhage, third trimester: O44.03

## 2018-01-28 NOTE — Progress Notes (Signed)
Alison MurrayHeidi Erbes is a 37 y.o. female patient.  1. Placenta previa affecting delivery   2. Placenta previa antepartum in third trimester     Past Medical History:  Diagnosis Date  . Allergy   . AMA (advanced maternal age) primigravida 35+   . Complication of anesthesia    neurologist concerned about general anesthesia if needed  . Eczema   . Headache   . Placenta previa antepartum in third trimester 01/28/2018   PLACENTA PREVIA:  General counseling was then performed regarding placenta previa.  Ninety percent of placenta previa diagnosed in the early second trimester will resolve by term.  There is an association with IUGR therefore serial U/S every 4 weeks for placental location and fetal growth is indicated.  Patients have a 4-8% risk of recurrence in subsequent pregnancies.  . Polyhydramnios affecting pregnancy in third trimester 04/20/2016  . Seizures (HCC)     Current Outpatient Medications  Medication Sig Dispense Refill  . FOLIC ACID PO Take by mouth.    . IRON PO Take 1 tablet by mouth daily.    Marland Kitchen. LAMICTAL 100 MG tablet Take 50 mg by mouth daily. At night  6  . LAMICTAL XR 200 MG TB24 Take 4 tablets by mouth at bedtime. Take 600mg  at 10am and 10pm  6  . Prenatal Vit-Fe Fumarate-FA (PRENATAL MULTIVITAMIN) TABS tablet Take 1 tablet by mouth daily at 12 noon. 100 tablet 3  . VIMPAT 200 MG TABS tablet Take 200 mg by mouth 2 (two) times daily.  5  . Cholecalciferol (VITAMIN D PO) Take 1 tablet by mouth daily.    . Cyanocobalamin (B-12 PO) Take 1 tablet by mouth daily.    Marland Kitchen. ibuprofen (ADVIL,MOTRIN) 600 MG tablet Take 1 tablet (600 mg total) by mouth every 6 (six) hours as needed. (Patient not taking: Reported on 10/29/2017) 40 tablet 0   No current facility-administered medications for this encounter.    Allergies  Allergen Reactions  . Peanut-Containing Drug Products     Feels peanuts contraindicates medicine triggering seizures.  . Augmentin [Amoxicillin-Pot Clavulanate] Rash    Active Problems:   * No active hospital problems. *  Blood pressure 100/60, pulse 85, weight 75.8 kg, last menstrual period 06/18/2017, unknown if currently breastfeeding.  Review of Systems  All other systems reviewed and are negative.   Physical Exam  Patsi SearsRobert L Jacobson 01/28/2018

## 2018-02-17 ENCOUNTER — Telehealth (HOSPITAL_COMMUNITY): Payer: Self-pay | Admitting: *Deleted

## 2018-02-17 NOTE — Telephone Encounter (Signed)
Preadmission screen  

## 2018-02-18 ENCOUNTER — Telehealth (HOSPITAL_COMMUNITY): Payer: Self-pay | Admitting: *Deleted

## 2018-02-18 NOTE — Telephone Encounter (Signed)
Preadmission screen  

## 2018-02-20 ENCOUNTER — Encounter (HOSPITAL_COMMUNITY): Payer: Self-pay

## 2018-02-21 NOTE — H&P (Signed)
Amber Taylor is an 37 y.o. 192P1001 female presenting at 39+ weeks for primary cesarean section. Pt noted to have a persistent placenta previa. She is dated per LMP and confirmed by a 6 week US. She has a history of seizure disorder that is somewhat stable on lamictal and vimpat. She had several seizures during the course of the pregnancy. She declined genetic testing  Pertinent Gynecological History: Menses: pregnant Sexually transmitted diseases: no past history Previous GYN Procedures: none  Last mammogram: n/a Date:  Last pap: normal Date: 01/2015 OB History: G2, P1001   Menstrual History: Menarche age: [age] Patient's last menstrual period was 06/18/2017.    Past Medical History:  Diagnosis Date  . Allergy   . AMA (advanced maternal age) primigravida 35+   . Complication of anesthesia    neurologist concerned about general anesthesia if needed  . Eczema   . Headache   . Placenta previa antepartum in third trimester 01/28/2018   PLACENTA PREVIA:  General counseling was then performed regarding placenta previa.  Ninety percent of placenta previa diagnosed in the early second trimester will resolve by term.  There is an association with IUGR therefore serial U/S every 4 weeks for placental location and fetal growth is indicated.  Patients have a 4-8% risk of recurrence in subsequent pregnancies.  . Polyhydramnios affecting pregnancy in third trimester 04/20/2016  . Seizures (HCC)     Past Surgical History:  Procedure Laterality Date  . CHOLECYSTECTOMY    . WISDOM TOOTH EXTRACTION      Family History  Problem Relation Age of Onset  . Cancer Mother        breast  . Cancer Maternal Aunt   . Cancer Maternal Grandfather   . Heart disease Paternal Grandmother     Social History:  reports that she has never smoked. She has never used smokeless tobacco. She reports that she drinks alcohol. She reports that she does not use drugs.  Allergies:  Allergies  Allergen Reactions  .  Peanut-Containing Drug Products     Feels peanuts contraindicates medicine triggering seizures.  . Augmentin [Amoxicillin-Pot Clavulanate] Rash    No medications prior to admission.    Review of Systems  Constitutional: Negative for chills, fever, malaise/fatigue and weight loss.  Eyes: Negative for blurred vision and double vision.  Respiratory: Negative for shortness of breath.   Cardiovascular: Negative for chest pain and leg swelling.  Gastrointestinal: Negative for abdominal pain, heartburn, nausea and vomiting.  Genitourinary: Negative for dysuria.  Musculoskeletal: Negative for myalgias.  Skin: Negative for itching and rash.  Neurological: Positive for seizures and loss of consciousness. Negative for dizziness and headaches.  Endo/Heme/Allergies: Does not bruise/bleed easily.  Psychiatric/Behavioral: Negative for depression, hallucinations, substance abuse and suicidal ideas. The patient is not nervous/anxious.     Last menstrual period 06/18/2017, unknown if currently breastfeeding. Physical Exam  Constitutional: She is oriented to person, place, and time. She appears well-developed and well-nourished.  Neck: Normal range of motion.  Cardiovascular: Normal rate.  Respiratory: Effort normal.  GI: Soft. Bowel sounds are normal.  Genitourinary: Vagina normal and uterus normal.  Musculoskeletal: Normal range of motion.  Neurological: She is alert and oriented to person, place, and time.  Skin: Skin is warm.  Psychiatric: She has a normal mood and affect. Her behavior is normal. Judgment and thought content normal.    No results found for this or any previous visit (from the past 24 hour(s)).  No results found.  Assessment/Plan: 37yo G2P1001 presenting  for scheduled primary cesarean section due to term pregnancy with placenta previa; seizure disorder - multiple in pregnancy - To OR when ready  - Risks, benefits and alternatives to procedure reviewed - All questions  answered    Cathrine Taylor 02/21/2018, 5:56 PM

## 2018-02-26 ENCOUNTER — Ambulatory Visit (HOSPITAL_COMMUNITY)
Admission: RE | Admit: 2018-02-26 | Discharge: 2018-02-26 | Disposition: A | Discharge status: Home / Self Care | Payer: Self-pay | Source: Ambulatory Visit | Attending: Obstetrics and Gynecology | Admitting: Obstetrics and Gynecology

## 2018-02-27 NOTE — Patient Instructions (Signed)
Amber MurrayHeidi Taylor  02/27/2018   Your procedure is scheduled on:  03/04/2018  Enter through the Main Entrance of [facility] at 1:15 PM.  Pick up the phone at the desk and dial [phone]  Call this number if you have problems the morning of surgery:[phone]  Remember:   Do not eat food:(After Midnight) Desps de medianoche.  Do not drink clear liquids: (6 Hours before arrival) 6 horas ante llegada. 0715 AM  Take these medicines the morning of surgery with A SIP OF WATER: TAKE YOUR LAMICTAL AND VIMPAT AS PRESCRIBED BUT NO OTHER MEDICATIONS   Do not wear jewelry, make-up or nail polish.  Do not wear lotions, powders, or perfumes. Do not wear deodorant.  Do not shave 48 hours prior to surgery.  Do not bring valuables to the hospital.  [facility] is not   responsible for any belongings or valuables brought to the hospital.  Contacts, dentures or bridgework may not be worn into surgery.  Leave suitcase in the car. After surgery it may be brought to your room.  For patients admitted to the hospital, checkout time is 11:00 AM the day of              discharge.    N/A   Please read over the following fact sheets that you were given:   Surgical Site Infection Prevention

## 2018-02-28 ENCOUNTER — Ambulatory Visit (HOSPITAL_COMMUNITY)
Admission: RE | Admit: 2018-02-28 | Discharge: 2018-02-28 | Disposition: A | Discharge status: Home / Self Care | Payer: Self-pay | Source: Ambulatory Visit | Attending: Obstetrics and Gynecology | Admitting: Obstetrics and Gynecology

## 2018-02-28 DIAGNOSIS — Z3490 Encounter for supervision of normal pregnancy, unspecified, unspecified trimester: Secondary | ICD-10-CM

## 2018-03-03 ENCOUNTER — Other Ambulatory Visit (HOSPITAL_COMMUNITY): Payer: Self-pay | Admitting: Obstetrics and Gynecology

## 2018-03-03 ENCOUNTER — Ambulatory Visit (HOSPITAL_BASED_OUTPATIENT_CLINIC_OR_DEPARTMENT_OTHER)
Admission: RE | Admit: 2018-03-03 | Discharge: 2018-03-03 | Disposition: A | Discharge status: Home / Self Care | Payer: Self-pay | Source: Ambulatory Visit | Attending: Obstetrics and Gynecology | Admitting: Obstetrics and Gynecology

## 2018-03-03 ENCOUNTER — Encounter (HOSPITAL_COMMUNITY)
Admission: RE | Admit: 2018-03-03 | Discharge: 2018-03-03 | Disposition: A | Discharge status: Home / Self Care | Payer: Self-pay | Source: Ambulatory Visit | Attending: Obstetrics and Gynecology | Admitting: Obstetrics and Gynecology

## 2018-03-03 ENCOUNTER — Encounter (HOSPITAL_COMMUNITY): Payer: Self-pay

## 2018-03-03 DIAGNOSIS — Z362 Encounter for other antenatal screening follow-up: Secondary | ICD-10-CM

## 2018-03-03 DIAGNOSIS — O09523 Supervision of elderly multigravida, third trimester: Secondary | ICD-10-CM

## 2018-03-03 DIAGNOSIS — Z3A36 36 weeks gestation of pregnancy: Secondary | ICD-10-CM

## 2018-03-03 DIAGNOSIS — O4403 Placenta previa specified as without hemorrhage, third trimester: Secondary | ICD-10-CM

## 2018-03-03 LAB — CBC
HCT: 32.8 % — ABNORMAL LOW (ref 36.0–46.0)
HEMOGLOBIN: 11.1 g/dL — AB (ref 12.0–15.0)
MCH: 31.7 pg (ref 26.0–34.0)
MCHC: 33.8 g/dL (ref 30.0–36.0)
MCV: 93.7 fL (ref 78.0–100.0)
PLATELETS: 186 10*3/uL (ref 150–400)
RBC: 3.5 MIL/uL — AB (ref 3.87–5.11)
RDW: 12.9 % (ref 11.5–15.5)
WBC: 6.8 10*3/uL (ref 4.0–10.5)

## 2018-03-04 ENCOUNTER — Inpatient Hospital Stay (HOSPITAL_COMMUNITY)
Admission: RE | Admit: 2018-03-04 | Discharge: 2018-03-07 | DRG: 784 | Disposition: A | Discharge status: Home / Self Care | Payer: Self-pay | Attending: Obstetrics and Gynecology | Admitting: Obstetrics and Gynecology

## 2018-03-04 ENCOUNTER — Inpatient Hospital Stay (HOSPITAL_COMMUNITY): Payer: Self-pay

## 2018-03-04 ENCOUNTER — Encounter (HOSPITAL_COMMUNITY): Payer: Self-pay | Admitting: *Deleted

## 2018-03-04 ENCOUNTER — Encounter (HOSPITAL_COMMUNITY)
Admission: RE | Disposition: A | Discharge status: Home / Self Care | Payer: Self-pay | Source: Home / Self Care | Attending: Obstetrics and Gynecology

## 2018-03-04 DIAGNOSIS — Z3A39 39 weeks gestation of pregnancy: Secondary | ICD-10-CM

## 2018-03-04 DIAGNOSIS — O99354 Diseases of the nervous system complicating childbirth: Secondary | ICD-10-CM | POA: Diagnosis present

## 2018-03-04 DIAGNOSIS — D649 Anemia, unspecified: Secondary | ICD-10-CM | POA: Diagnosis present

## 2018-03-04 DIAGNOSIS — O4400 Placenta previa specified as without hemorrhage, unspecified trimester: Secondary | ICD-10-CM

## 2018-03-04 DIAGNOSIS — Z98891 History of uterine scar from previous surgery: Secondary | ICD-10-CM

## 2018-03-04 DIAGNOSIS — O3483 Maternal care for other abnormalities of pelvic organs, third trimester: Secondary | ICD-10-CM | POA: Diagnosis present

## 2018-03-04 DIAGNOSIS — O4403 Placenta previa specified as without hemorrhage, third trimester: Principal | ICD-10-CM | POA: Diagnosis present

## 2018-03-04 DIAGNOSIS — N838 Other noninflammatory disorders of ovary, fallopian tube and broad ligament: Secondary | ICD-10-CM | POA: Diagnosis present

## 2018-03-04 DIAGNOSIS — Z3490 Encounter for supervision of normal pregnancy, unspecified, unspecified trimester: Secondary | ICD-10-CM

## 2018-03-04 DIAGNOSIS — O9902 Anemia complicating childbirth: Secondary | ICD-10-CM | POA: Diagnosis present

## 2018-03-04 DIAGNOSIS — Z880 Allergy status to penicillin: Secondary | ICD-10-CM

## 2018-03-04 DIAGNOSIS — G40909 Epilepsy, unspecified, not intractable, without status epilepticus: Secondary | ICD-10-CM | POA: Diagnosis present

## 2018-03-04 HISTORY — DX: History of uterine scar from previous surgery: Z98.891

## 2018-03-04 LAB — RPR: RPR Ser Ql: NONREACTIVE

## 2018-03-04 SURGERY — Surgical Case
Anesthesia: Spinal | Site: Abdomen | Wound class: Clean Contaminated

## 2018-03-04 MED ORDER — KETOROLAC TROMETHAMINE 30 MG/ML IJ SOLN
INTRAMUSCULAR | Status: AC
  Filled 2018-03-04: qty 1

## 2018-03-04 MED ORDER — SCOPOLAMINE 1 MG/3DAYS TD PT72
1.0000 | MEDICATED_PATCH | Freq: Once | TRANSDERMAL | Status: AC
  Administered 2018-03-04: 1.5 mg via TRANSDERMAL

## 2018-03-04 MED ORDER — ONDANSETRON HCL 4 MG/2ML IJ SOLN
INTRAMUSCULAR | Status: DC | PRN
  Administered 2018-03-04: 4 mg via INTRAVENOUS

## 2018-03-04 MED ORDER — FENTANYL CITRATE (PF) 100 MCG/2ML IJ SOLN: 25.0000 ug | INTRAMUSCULAR | Status: DC | PRN

## 2018-03-04 MED ORDER — NALBUPHINE HCL 10 MG/ML IJ SOLN: 5.0000 mg | INTRAMUSCULAR | Status: DC | PRN

## 2018-03-04 MED ORDER — ZOLPIDEM TARTRATE 5 MG PO TABS: 5.0000 mg | ORAL_TABLET | Freq: Every evening | ORAL | Status: DC | PRN

## 2018-03-04 MED ORDER — PHENYLEPHRINE HCL 10 MG/ML IJ SOLN
INTRAMUSCULAR | Status: DC | PRN
  Administered 2018-03-04: 80 ug via INTRAVENOUS
  Administered 2018-03-04 (×2): 40 ug via INTRAVENOUS

## 2018-03-04 MED ORDER — ACETAMINOPHEN 325 MG PO TABS: 325.0000 mg | ORAL_TABLET | ORAL | Status: DC | PRN

## 2018-03-04 MED ORDER — MEPERIDINE HCL 25 MG/ML IJ SOLN: 6.2500 mg | INTRAMUSCULAR | Status: DC | PRN

## 2018-03-04 MED ORDER — KETOROLAC TROMETHAMINE 30 MG/ML IJ SOLN
30.0000 mg | Freq: Four times a day (QID) | INTRAMUSCULAR | Status: AC | PRN
  Administered 2018-03-04: 30 mg via INTRAMUSCULAR

## 2018-03-04 MED ORDER — MENTHOL 3 MG MT LOZG: 1.0000 | LOZENGE | OROMUCOSAL | Status: DC | PRN

## 2018-03-04 MED ORDER — LAMOTRIGINE ER 300 MG PO TB24
300.0000 mg | ORAL_TABLET | Freq: Two times a day (BID) | ORAL | Status: DC
  Administered 2018-03-04 – 2018-03-07 (×6): 300 mg via ORAL

## 2018-03-04 MED ORDER — PHENYLEPHRINE 8 MG IN D5W 100 ML (0.08MG/ML) PREMIX OPTIME
INJECTION | INTRAVENOUS | Status: DC | PRN
  Administered 2018-03-04: 60 ug/min via INTRAVENOUS

## 2018-03-04 MED ORDER — SIMETHICONE 80 MG PO CHEW
80.0000 mg | CHEWABLE_TABLET | ORAL | Status: DC
  Administered 2018-03-05 – 2018-03-06 (×3): 80 mg via ORAL
  Filled 2018-03-04 (×3): qty 1

## 2018-03-04 MED ORDER — PHENYLEPHRINE 40 MCG/ML (10ML) SYRINGE FOR IV PUSH (FOR BLOOD PRESSURE SUPPORT)
PREFILLED_SYRINGE | INTRAVENOUS | Status: AC
  Filled 2018-03-04: qty 10

## 2018-03-04 MED ORDER — PRENATAL MULTIVITAMIN CH
1.0000 | ORAL_TABLET | Freq: Every day | ORAL | Status: DC
  Administered 2018-03-05 – 2018-03-07 (×3): 1 via ORAL
  Filled 2018-03-04 (×3): qty 1

## 2018-03-04 MED ORDER — LACTATED RINGERS IV SOLN
INTRAVENOUS | Status: DC | PRN
  Administered 2018-03-04: 16:00:00 via INTRAVENOUS

## 2018-03-04 MED ORDER — SIMETHICONE 80 MG PO CHEW
80.0000 mg | CHEWABLE_TABLET | Freq: Three times a day (TID) | ORAL | Status: DC
  Administered 2018-03-05 – 2018-03-07 (×5): 80 mg via ORAL
  Filled 2018-03-04 (×6): qty 1

## 2018-03-04 MED ORDER — WITCH HAZEL-GLYCERIN EX PADS: 1.0000 "application " | MEDICATED_PAD | CUTANEOUS | Status: DC | PRN

## 2018-03-04 MED ORDER — TETANUS-DIPHTH-ACELL PERTUSSIS 5-2.5-18.5 LF-MCG/0.5 IM SUSP: 0.5000 mL | Freq: Once | INTRAMUSCULAR | Status: DC

## 2018-03-04 MED ORDER — NALBUPHINE HCL 10 MG/ML IJ SOLN: 5.0000 mg | Freq: Once | INTRAMUSCULAR | Status: DC | PRN

## 2018-03-04 MED ORDER — ONDANSETRON HCL 4 MG/2ML IJ SOLN: 4.0000 mg | Freq: Three times a day (TID) | INTRAMUSCULAR | Status: DC | PRN

## 2018-03-04 MED ORDER — FENTANYL CITRATE (PF) 100 MCG/2ML IJ SOLN
INTRAMUSCULAR | Status: AC
  Filled 2018-03-04: qty 2

## 2018-03-04 MED ORDER — SCOPOLAMINE 1 MG/3DAYS TD PT72
MEDICATED_PATCH | TRANSDERMAL | Status: AC
  Filled 2018-03-04: qty 1

## 2018-03-04 MED ORDER — DIBUCAINE 1 % RE OINT: 1.0000 "application " | TOPICAL_OINTMENT | RECTAL | Status: DC | PRN

## 2018-03-04 MED ORDER — LACTATED RINGERS IV SOLN
INTRAVENOUS | Status: DC
  Administered 2018-03-04 (×3): via INTRAVENOUS

## 2018-03-04 MED ORDER — DIPHENHYDRAMINE HCL 50 MG/ML IJ SOLN: 12.5000 mg | INTRAMUSCULAR | Status: DC | PRN

## 2018-03-04 MED ORDER — KETOROLAC TROMETHAMINE 30 MG/ML IJ SOLN: 30.0000 mg | Freq: Four times a day (QID) | INTRAMUSCULAR | Status: AC | PRN

## 2018-03-04 MED ORDER — IBUPROFEN 600 MG PO TABS
600.0000 mg | ORAL_TABLET | Freq: Four times a day (QID) | ORAL | Status: DC
  Administered 2018-03-05 – 2018-03-07 (×11): 600 mg via ORAL
  Filled 2018-03-04 (×11): qty 1

## 2018-03-04 MED ORDER — CLINDAMYCIN PHOSPHATE 900 MG/50ML IV SOLN
900.0000 mg | Freq: Once | INTRAVENOUS | Status: DC
  Filled 2018-03-04: qty 50

## 2018-03-04 MED ORDER — OXYCODONE HCL 5 MG/5ML PO SOLN: 5.0000 mg | Freq: Once | ORAL | Status: DC | PRN

## 2018-03-04 MED ORDER — OXYCODONE HCL 5 MG PO TABS: 5.0000 mg | ORAL_TABLET | ORAL | Status: DC | PRN

## 2018-03-04 MED ORDER — SODIUM CHLORIDE 0.9% FLUSH: 3.0000 mL | INTRAVENOUS | Status: DC | PRN

## 2018-03-04 MED ORDER — ACETAMINOPHEN 325 MG PO TABS
650.0000 mg | ORAL_TABLET | ORAL | Status: DC | PRN
  Administered 2018-03-04 – 2018-03-05 (×2): 650 mg via ORAL
  Filled 2018-03-04 (×2): qty 2

## 2018-03-04 MED ORDER — LORAZEPAM 2 MG/ML IJ SOLN
0.5000 mg | Freq: Four times a day (QID) | INTRAMUSCULAR | Status: DC | PRN
  Administered 2018-03-05 – 2018-03-06 (×4): 0.5 mg via INTRAVENOUS
  Filled 2018-03-04 (×7): qty 0.25

## 2018-03-04 MED ORDER — ONDANSETRON HCL 4 MG/2ML IJ SOLN: 4.0000 mg | Freq: Once | INTRAMUSCULAR | Status: DC | PRN

## 2018-03-04 MED ORDER — MORPHINE SULFATE (PF) 0.5 MG/ML IJ SOLN
INTRAMUSCULAR | Status: DC | PRN
  Administered 2018-03-04: .15 mg via INTRATHECAL

## 2018-03-04 MED ORDER — OXYCODONE HCL 5 MG PO TABS: 5.0000 mg | ORAL_TABLET | Freq: Once | ORAL | Status: DC | PRN

## 2018-03-04 MED ORDER — FENTANYL CITRATE (PF) 100 MCG/2ML IJ SOLN
INTRAMUSCULAR | Status: DC | PRN
  Administered 2018-03-04: 15 ug via INTRATHECAL

## 2018-03-04 MED ORDER — OXYCODONE HCL 5 MG PO TABS: 10.0000 mg | ORAL_TABLET | ORAL | Status: DC | PRN

## 2018-03-04 MED ORDER — MORPHINE SULFATE (PF) 0.5 MG/ML IJ SOLN
INTRAMUSCULAR | Status: AC
  Filled 2018-03-04: qty 10

## 2018-03-04 MED ORDER — NALOXONE HCL 4 MG/10ML IJ SOLN: 1.0000 ug/kg/h | INTRAVENOUS | Status: DC | PRN

## 2018-03-04 MED ORDER — ACETAMINOPHEN 160 MG/5ML PO SOLN: 325.0000 mg | ORAL | Status: DC | PRN

## 2018-03-04 MED ORDER — DEXAMETHASONE SODIUM PHOSPHATE 4 MG/ML IJ SOLN
INTRAMUSCULAR | Status: AC
  Filled 2018-03-04: qty 1

## 2018-03-04 MED ORDER — DEXAMETHASONE SODIUM PHOSPHATE 10 MG/ML IJ SOLN
INTRAMUSCULAR | Status: DC | PRN
  Administered 2018-03-04: 4 mg via INTRAVENOUS

## 2018-03-04 MED ORDER — NALOXONE HCL 0.4 MG/ML IJ SOLN: 0.4000 mg | INTRAMUSCULAR | Status: DC | PRN

## 2018-03-04 MED ORDER — DIPHENHYDRAMINE HCL 25 MG PO CAPS: 25.0000 mg | ORAL_CAPSULE | Freq: Four times a day (QID) | ORAL | Status: DC | PRN

## 2018-03-04 MED ORDER — SENNOSIDES-DOCUSATE SODIUM 8.6-50 MG PO TABS
2.0000 | ORAL_TABLET | ORAL | Status: DC
  Administered 2018-03-05 – 2018-03-06 (×3): 2 via ORAL
  Filled 2018-03-04 (×3): qty 2

## 2018-03-04 MED ORDER — COCONUT OIL OIL
1.0000 "application " | TOPICAL_OIL | Status: DC | PRN
  Administered 2018-03-06: 1 via TOPICAL
  Filled 2018-03-04: qty 120

## 2018-03-04 MED ORDER — DIPHENHYDRAMINE HCL 25 MG PO CAPS: 25.0000 mg | ORAL_CAPSULE | ORAL | Status: DC | PRN

## 2018-03-04 MED ORDER — OXYTOCIN 10 UNIT/ML IJ SOLN
INTRAVENOUS | Status: DC | PRN
  Administered 2018-03-04: 40 [IU] via INTRAVENOUS

## 2018-03-04 MED ORDER — OXYTOCIN 40 UNITS IN LACTATED RINGERS INFUSION - SIMPLE MED: 2.5000 [IU]/h | INTRAVENOUS | Status: AC

## 2018-03-04 MED ORDER — LACTATED RINGERS IV SOLN
INTRAVENOUS | Status: DC
  Administered 2018-03-05: 01:00:00 via INTRAVENOUS

## 2018-03-04 MED ORDER — BUPIVACAINE IN DEXTROSE 0.75-8.25 % IT SOLN
INTRATHECAL | Status: DC | PRN
  Administered 2018-03-04: 1.8 mL via INTRATHECAL

## 2018-03-04 MED ORDER — CLINDAMYCIN PHOSPHATE 900 MG/50ML IV SOLN
900.0000 mg | Freq: Once | INTRAVENOUS | Status: AC
  Administered 2018-03-04: 900 mg via INTRAVENOUS
  Filled 2018-03-04: qty 50

## 2018-03-04 MED ORDER — SIMETHICONE 80 MG PO CHEW: 80.0000 mg | CHEWABLE_TABLET | ORAL | Status: DC | PRN

## 2018-03-04 SURGICAL SUPPLY — 39 items
BENZOIN TINCTURE PRP APPL 2/3 (GAUZE/BANDAGES/DRESSINGS) ×2 IMPLANT
CELLS DAT CNTRL 66122 CELL SVR (MISCELLANEOUS) ×1 IMPLANT
CHLORAPREP W/TINT 26ML (MISCELLANEOUS) ×2 IMPLANT
CLAMP CORD UMBIL (MISCELLANEOUS) IMPLANT
CLOTH BEACON ORANGE TIMEOUT ST (SAFETY) ×2 IMPLANT
DRAPE C SECTION CLR SCREEN (DRAPES) ×2 IMPLANT
DRSG OPSITE POSTOP 4X10 (GAUZE/BANDAGES/DRESSINGS) ×2 IMPLANT
ELECT REM PT RETURN 9FT ADLT (ELECTROSURGICAL) ×2
ELECTRODE REM PT RTRN 9FT ADLT (ELECTROSURGICAL) ×1 IMPLANT
EXTRACTOR VACUUM KIWI (MISCELLANEOUS) IMPLANT
GLOVE BIO SURGEON STRL SZ 6.5 (GLOVE) ×2 IMPLANT
GLOVE BIOGEL PI IND STRL 7.0 (GLOVE) ×2 IMPLANT
GLOVE BIOGEL PI INDICATOR 7.0 (GLOVE) ×2
GOWN STRL REUS W/TWL LRG LVL3 (GOWN DISPOSABLE) ×4 IMPLANT
KIT ABG SYR 3ML LUER SLIP (SYRINGE) IMPLANT
NEEDLE HYPO 25X5/8 SAFETYGLIDE (NEEDLE) IMPLANT
NS IRRIG 1000ML POUR BTL (IV SOLUTION) ×2 IMPLANT
PACK C SECTION WH (CUSTOM PROCEDURE TRAY) ×2 IMPLANT
PAD ABD 8X7 1/2 STERILE (GAUZE/BANDAGES/DRESSINGS) ×2 IMPLANT
PAD OB MATERNITY 4.3X12.25 (PERSONAL CARE ITEMS) ×2 IMPLANT
RETRACTOR WND ALEXIS 25 LRG (MISCELLANEOUS) ×1 IMPLANT
RTRCTR C-SECT PINK 25CM LRG (MISCELLANEOUS) IMPLANT
RTRCTR WOUND ALEXIS 18CM MED (MISCELLANEOUS) ×2
RTRCTR WOUND ALEXIS 25CM LRG (MISCELLANEOUS) ×2
SPONGE GAUZE 4X4 12PLY STER LF (GAUZE/BANDAGES/DRESSINGS) ×2 IMPLANT
SPONGE LAP 18X18 RF (DISPOSABLE) ×6 IMPLANT
STRIP CLOSURE SKIN 1/2X4 (GAUZE/BANDAGES/DRESSINGS) ×2 IMPLANT
SUT CHROMIC 1 CTX 36 (SUTURE) ×4 IMPLANT
SUT PLAIN 0 NONE (SUTURE) IMPLANT
SUT PLAIN 2 0 XLH (SUTURE) ×2 IMPLANT
SUT VIC AB 0 CT1 27 (SUTURE) ×2
SUT VIC AB 0 CT1 27XBRD ANBCTR (SUTURE) ×2 IMPLANT
SUT VIC AB 2-0 CT1 27 (SUTURE) ×1
SUT VIC AB 2-0 CT1 TAPERPNT 27 (SUTURE) ×1 IMPLANT
SUT VIC AB 3-0 CT1 27 (SUTURE)
SUT VIC AB 3-0 CT1 TAPERPNT 27 (SUTURE) IMPLANT
SUT VIC AB 4-0 KS 27 (SUTURE) ×2 IMPLANT
TOWEL OR 17X24 6PK STRL BLUE (TOWEL DISPOSABLE) ×2 IMPLANT
TRAY FOLEY W/BAG SLVR 14FR LF (SET/KITS/TRAYS/PACK) ×2 IMPLANT

## 2018-03-04 NOTE — Op Note (Signed)
Operative Note    Preoperative Diagnosis: 1. IUP at 37 weeks                                              2. Placenta previa                                             3. Seizure disorder   Postoperative Diagnosis: Same                                                4. Left paratubal cyst   Procedure: Primary low transverse cesarean section with double layered closure, left paratubal cystectomy   Surgeon: Britt Bottom DO Assist: RNFA Heather  Anesthesia: Spinal  Fluids: LR EBL: UOP:   Findings: Viable female infant in cephalic presentation, grossly normal uterus and ovaries, simple appearing paratubal cyst of left fallopian tube - measuring approx 4-5cm   Specimen: Left paratubal cyst and placenta to pathology   Procedure Note Patient was taken to the operating room where spinal anesthesia administered. An appropriate time out was performed. A foley catheter was placed.  She was prepped and draped in the normal sterile fashion and placed in the dorsal supine position with a leftward tilt. . A Pfannenstiel skin incision was then made with the scalpel 2 finger breaths above her pubic symphysis and carried through to the underlying layer of fascia. The fascia was nicked in the midline and the incision was extended laterally with Mayo scissors. The superior then inferior aspects of the incision were dissected off the underlying rectus muscles.  Rectus muscles were separated in the midline and the peritoneal cavity entered bluntly. The peritoneal incision was then extended both superiorly and inferiorly with careful attention to avoid both bowel and bladder. The Alexis self-retaining wound retractor was then placed within the incision and the lower uterine segment exposed. The bladder flap was developed with Metzenbaum scissors and pushed away from the lower uterine segment. The lower uterine segment was then incised in a transverse fashion and the cavity itself entered  bluntly. The infant's head was then lifted and delivered from the incision without difficulty. Loose nuchal x 1 was reduced over infants head. The remainder of the infant delivered easily. Nose and mouth were bulb suctioned and after a minute delay, the cord clamped and cut.The infant was handed off to the waiting pediatricians with vigorous spontaneous cry noted. The placenta was then spontaneously expressed from the uterus and the uterus cleared of all clots and debris with moist lap sponge. The uterine incision was then repaired in 2 layers: the first layer was a running locked layer 1-0 chromic and the second an imbricating layer of the same suture. The tubes and ovaries were inspected and the gutters cleared of all clots and debris. There was a large paratubal cyst noted on the left fallopian tube. This was suture ligated then cut. The fallopian tube was noted to be intact. The uterine incision was inspected and found to be hemostatic. All instruments and sponges as well as the Alexis retractor were then removed from the abdomen.  The rectus muscles and peritoneum were then reapproximated with in a running fashion using  2-0 Vicryl. The fascia was then closed with 0 Vicryl in a running stitch.  The skin was closed with a subcuticular stitch of 4-0 Vicryl on a Keith needle and then reinforced with benzoin and Steri-Strips. At the conclusion of the procedure all instruments and sponge counts were correct. Patient was taken to the recovery room in good condition with her baby accompanying her skin to skin.  Apgars 9,9 ; weight pending

## 2018-03-04 NOTE — Anesthesia Preprocedure Evaluation (Signed)
Anesthesia Evaluation   Patient awake    Reviewed: Allergy & Precautions, Patient's Chart, lab work & pertinent test results  History of Anesthesia Complications Negative for: history of anesthetic complications  Airway Mallampati: II  TM Distance: >3 FB Neck ROM: Full    Dental  (+) Teeth Intact   Pulmonary neg pulmonary ROS,    breath sounds clear to auscultation       Cardiovascular negative cardio ROS   Rhythm:Regular     Neuro/Psych  Headaches, Seizures -,  negative psych ROS   GI/Hepatic negative GI ROS, Neg liver ROS,   Endo/Other  negative endocrine ROS  Renal/GU negative Renal ROS     Musculoskeletal   Abdominal   Peds  Hematology  (+) anemia ,   Anesthesia Other Findings   Reproductive/Obstetrics                             Anesthesia Physical  Anesthesia Plan  ASA: II  Anesthesia Plan: Spinal   Post-op Pain Management:    Induction:   PONV Risk Score and Plan: 2 and Ondansetron, Treatment may vary due to age or medical condition and Scopolamine patch - Pre-op  Airway Management Planned: Nasal Cannula and Natural Airway  Additional Equipment:   Intra-op Plan:   Post-operative Plan:   Informed Consent: I have reviewed the patients History and Physical, chart, labs and discussed the procedure including the risks, benefits and alternatives for the proposed anesthesia with the patient or authorized representative who has indicated his/her understanding and acceptance.     Plan Discussed with: Anesthesiologist, Surgeon and CRNA  Anesthesia Plan Comments:         Anesthesia Quick Evaluation

## 2018-03-04 NOTE — Anesthesia Postprocedure Evaluation (Signed)
Anesthesia Post Note  Patient: Amber MurrayHeidi Taylor  Procedure(s) Performed: CESAREAN SECTION (N/A Abdomen)     Patient location during evaluation: PACU Anesthesia Type: Spinal Level of consciousness: oriented and awake and alert Pain management: pain level controlled Vital Signs Assessment: post-procedure vital signs reviewed and stable Respiratory status: spontaneous breathing, respiratory function stable and patient connected to nasal cannula oxygen Cardiovascular status: blood pressure returned to baseline and stable Postop Assessment: no headache, no backache and no apparent nausea or vomiting Anesthetic complications: no    Last Vitals:  Vitals:   03/04/18 1349 03/04/18 1651  BP: 123/67 100/60  Pulse: (!) 101 77  Resp: 16 12  Temp: 36.9 C (!) 36.4 C  SpO2:  97%    Last Pain:  Vitals:   03/04/18 1651  TempSrc: Oral   Pain Goal:                 Truda Staub

## 2018-03-04 NOTE — Plan of Care (Signed)
Pt's neurologist is Dr Rivka SaferBraunstein with [facility] Cell [phone]  Rec: 1. Decrease lamictal to prepregnancy regimen after              delivery ie 300mg  XR bid 2. IVF hydration 3. Ativan 0.5mg  IV after delivery prn

## 2018-03-04 NOTE — Transfer of Care (Signed)
Immediate Anesthesia Transfer of Care Note  Patient: Amber MurrayHeidi Grainger  Procedure(s) Performed: CESAREAN SECTION (N/A Abdomen)  Patient Location: PACU  Anesthesia Type:Spinal  Level of Consciousness: awake, alert  and oriented  Airway & Oxygen Therapy: Patient Spontanous Breathing  Post-op Assessment: Report given to RN and Post -op Vital signs reviewed and stable  Post vital signs: Reviewed and stable  Last Vitals:  Vitals Value Taken Time  BP 100/60 03/04/2018  4:51 PM  Temp    Pulse 86 03/04/2018  4:52 PM  Resp 12 03/04/2018  4:52 PM  SpO2 95 % 03/04/2018  4:52 PM  Vitals shown include unvalidated device data.  Last Pain:  Vitals:   03/04/18 1349  TempSrc: Oral         Complications: No apparent anesthesia complications

## 2018-03-04 NOTE — Anesthesia Procedure Notes (Signed)
Spinal  Patient location during procedure: OR Start time: 03/04/2018 3:37 PM End time: 03/04/2018 3:39 PM Staffing Anesthesiologist: Bethena Midgetddono, Esmee Fallaw, MD Preanesthetic Checklist Completed: patient identified, site marked, surgical consent, pre-op evaluation, timeout performed, IV checked, risks and benefits discussed and monitors and equipment checked Spinal Block Patient position: sitting Prep: DuraPrep Patient monitoring: heart rate, cardiac monitor, continuous pulse ox and blood pressure Approach: midline Location: L3-4 Injection technique: single-shot Needle Needle type: Sprotte  Needle gauge: 24 G Needle length: 9 cm Assessment Sensory level: T4

## 2018-03-05 ENCOUNTER — Encounter (HOSPITAL_COMMUNITY): Payer: Self-pay | Admitting: Obstetrics and Gynecology

## 2018-03-05 LAB — CBC
HCT: 30.7 % — ABNORMAL LOW (ref 36.0–46.0)
HEMOGLOBIN: 10.5 g/dL — AB (ref 12.0–15.0)
MCH: 31.9 pg (ref 26.0–34.0)
MCHC: 34.2 g/dL (ref 30.0–36.0)
MCV: 93.3 fL (ref 78.0–100.0)
PLATELETS: 178 10*3/uL (ref 150–400)
RBC: 3.29 MIL/uL — AB (ref 3.87–5.11)
RDW: 12.9 % (ref 11.5–15.5)
WBC: 9.2 10*3/uL (ref 4.0–10.5)

## 2018-03-05 MED ORDER — RHO D IMMUNE GLOBULIN 1500 UNIT/2ML IJ SOSY
300.0000 ug | PREFILLED_SYRINGE | Freq: Once | INTRAMUSCULAR | Status: AC
  Administered 2018-03-05: 300 ug via INTRAVENOUS
  Filled 2018-03-05: qty 2

## 2018-03-05 MED ORDER — LACOSAMIDE 200 MG PO TABS
200.0000 mg | ORAL_TABLET | Freq: Two times a day (BID) | ORAL | Status: DC
  Administered 2018-03-06 – 2018-03-07 (×3): 200 mg via ORAL

## 2018-03-05 NOTE — Progress Notes (Signed)
Nurse called to room by pt.  Pt states "I don't feel right."  Offered Ativan and accepted.  Ativan given per orders.  Wasted 1.87mls verified by Elijah Birk Rn.  Pt informed nurse "I may need another dose of Lamictal 100 mg regular dose and not extended release."  Nurse will follow up with patient.

## 2018-03-05 NOTE — Lactation Note (Addendum)
This note was copied from a baby's chart. Lactation Consultation Note  Patient Name: Amber Taylor Date: 03/05/2018 Reason for consult: Initial assessment;Early term 37-38.6wks;Infant weight loss  30 hours old early term female at 2% weight loss who is being exclusively BF by her mother, she's a P2. Mom is experienced BF, she was able to BF her oldest child for 11 months without any difficulties. She has a Spectra DEBP at home. When assisting with hand expression noticed that mom had tiny scabs on the tip of her nipples but mom doesn't complain of any pain or discomfort while feeding, she voice she only felt pain when baby was "pinching". Advised mom to break the latch anytime it becomes painful/umcomfortable. Reviewed treatment for sore nipples, mom also rubbed big drops of colostrum obtained with hand expression on both nipples.  Offered assistance with latch and mom agreed to wake baby up to feed. Tried football position first but mom's bed wasn't high enough and baby did not have the best angle. Then tried cross cradle and baby was able to latch easily, but came off a few times and had to be repositioned. She was able to sustain the latch for at least 15 minutes, baby still nursing when exiting the room.  Reviewed with mom tips for a deep latch. Baby's chin is a little recessed so worked on positioning, cross cradle works best for her. Since baby is a 37 weeker, LC also recommended pumping, mom willing to try anything to help baby feeding. Cluster feeding was discussed. LC set up a DEBP, pump instructions, cleaning and storage were reviewed as well as milk storage guidelines.  Plan  1. Encouraged mom to feed baby STS 8-12 times/24 hours or sooner if feeding cues are present 2. Mom will start pumping tonight every 3 hours and at least once at night; a total of 6 pumping sessions in 24 hours 3. Parents will spoon feed baby any amount of EBM mom may get  BF brochure, BF resources and  feeding diary were reviewed, both parents reported all questions were answered, they are aware of LC services and will call PRN.    Maternal Data Formula Feeding for Exclusion: No Has patient been taught Hand Expression?: Yes Does the patient have breastfeeding experience prior to this delivery?: Yes  Feeding Feeding Type: Breast Fed Length of feed: 15 min(baby still nursing when exiting the room)  LATCH Score Latch: Repeated attempts needed to sustain latch, nipple held in mouth throughout feeding, stimulation needed to elicit sucking reflex.  Audible Swallowing: A few with stimulation  Type of Nipple: Everted at rest and after stimulation  Comfort (Breast/Nipple): Soft / non-tender  Hold (Positioning): Assistance needed to correctly position infant at breast and maintain latch.  LATCH Score: 7  Interventions Interventions: Breast feeding basics reviewed;Assisted with latch;Skin to skin;Breast massage;Hand express;Breast compression;DEBP;Position options;Support pillows;Adjust position  Lactation Tools Discussed/Used Tools: Pump Breast pump type: Double-Electric Breast Pump WIC Program: No Pump Review: Setup, frequency, and cleaning;Milk Storage Initiated by:: MPeck Date initiated:: 03/05/18   Consult Status Consult Status: Follow-up Date: 03/06/18 Follow-up type: In-patient    Amber Taylor 03/05/2018, 10:10 PM

## 2018-03-05 NOTE — Progress Notes (Signed)
Pt states "I feel better.  I would like to have Dr. Mindi SlickerBanga call my Neurologist to discuss my medications.  I think the Ativan will work for now but I don't want to have to keep taking it."  Pt and sig other also refused to have orthostatic bp's taken and foley removed.  Pt states "I need to rest so I don't have a seizure."   Pt instructed to notify nurse when needed.

## 2018-03-05 NOTE — Progress Notes (Signed)
Subjective: Postpartum Day 1: Cesarean Delivery Patient reports tolerating PO, + flatus and no problems voiding.  Pain well controlled. Had two doses of ativan iv - overnight and this am. No seizures. She denies any fever, chills, or CP. Bonding well with baby.   Objective: Vital signs in last 24 hours: Temp:  [97.3 F (36.3 C)-98.4 F (36.9 C)] 97.3 F (36.3 C) (09/25 0915) Pulse Rate:  [65-101] 68 (09/25 0915) Resp:  [12-23] 19 (09/25 0915) BP: (91-123)/(47-73) 91/60 (09/25 0915) SpO2:  [96 %-99 %] 98 % (09/25 0915) Weight:  [78.5 kg] 78.5 kg (09/24 1349)  Physical Exam:  General: alert, cooperative and no distress Lochia: appropriate Uterine Fundus: firm Incision: no significant drainage DVT Evaluation: No evidence of DVT seen on physical exam.SCDs in place  Recent Labs    03/03/18 1039 03/05/18 0659  HGB 11.1* 10.5*  HCT 32.8* 30.7*    Assessment/Plan: Status post Cesarean section. Doing well postoperatively.  Continue current care.  Amber Taylor 03/05/2018, 1:45 PM

## 2018-03-06 ENCOUNTER — Other Ambulatory Visit: Payer: Self-pay

## 2018-03-06 ENCOUNTER — Encounter (HOSPITAL_COMMUNITY): Payer: Self-pay | Admitting: Obstetrics and Gynecology

## 2018-03-06 LAB — RH IG WORKUP (INCLUDES ABO/RH)
ABO/RH(D): AB NEG
FETAL SCREEN: NEGATIVE
GESTATIONAL AGE(WKS): 37
Unit division: 0

## 2018-03-06 NOTE — Progress Notes (Signed)
Subjective: Postpartum Day 2: Cesarean Delivery Patient reports tolerating PO and no problems voiding.   She reports no seizures, but did take ativan last pm (only ordered IV)  Objective: Vital signs in last 24 hours: Temp:  [98 F (36.7 C)] 98 F (36.7 C) (09/25 2123) Pulse Rate:  [70-72] 72 (09/25 2123) Resp:  [16] 16 (09/25 2123) BP: (91-108)/(48-67) 108/67 (09/25 2123) SpO2:  [98 %] 98 % (09/25 2123)  Physical Exam:  General: alert and cooperative Lochia: appropriate Uterine Fundus: firm Incision: C/D/I   Recent Labs    03/03/18 1039 03/05/18 0659  HGB 11.1* 10.5*  HCT 32.8* 30.7*    Assessment/Plan: Status post Cesarean section. Doing well postoperatively.  Continue current care. Advised pt to check with pediatrics about continuing ativan with breastfeeding, and would not do ativan and oxycodone together Continues vimpat and lamictal  Oliver Pila 03/06/2018, 10:02 AM

## 2018-03-06 NOTE — Lactation Note (Signed)
This note was copied from a baby's chart. Lactation Consultation Note  Patient Name: Amber Taylor Today's Date: 03/06/2018   Of note, Mom is taking lamotrigine 300mg  bid (L2) & lacosamide 200mg  bid (L3). Dr. Sherryll Burger made aware of suggestion (per [facility] "Medications & Mother's Milk") for periodic infant plasma blood draws of lamotrigine (due to RID sometimes being as high as 30%). MD was also made aware that there is limited data on lacosamide & breastfeeding compatibility. Lacosamide's individual characteristics suggest there could be significant transfer into milk (per [facility] [place] "Medications & Mother's Milk" (2019).    Lurline Hare [facility] 03/06/2018, 9:55 AM

## 2018-03-06 NOTE — Lactation Note (Addendum)
This note was copied from a baby's chart. Lactation Consultation Note  Patient Name: Amber Taylor [id number] Date: 03/06/2018 Reason for consult: Follow-up assessment;Early term 37-38.6wks;Infant weight loss  Baby is 26 hours old , and as LC entered the room baby already latched on the left breast.  RN had done the Latch score of 8 .  LC noted multiple swallows , and per mom comfortable. Mom mentioned her nipples have  Been sensitive and LC noted the right nipple to be a-braised on the front part of the nipple.  LC reviewed the importance of hand expressing prior to latch to prime the milk ducts and  Not to allow the baby to nibble onto the breast, also easing chin downward due to it being recessed.  LC encouraged the EBM to nipples liberally. LC instructed mom on the use comfort gels after feedings And alternating with breast shells. Mom has been given coconut oil and LC reminded not to mix the two .  Baby still feeding at 25 mins.     Maternal Data    Feeding Feeding Type: Breast Fed Length of feed: (/ still feeding at 25 mins )  LATCH Score - ( Latch Score by the [facility] )  Latch: Grasps breast easily, tongue down, lips flanged, rhythmical sucking.  Audible Swallowing: A few with stimulation  Type of Nipple: Everted at rest and after stimulation  Comfort (Breast/Nipple): Filling, red/small blisters or bruises, mild/mod discomfort  Hold (Positioning): No assistance needed to correctly position infant at breast.  LATCH Score: 8  Interventions Interventions: Breast feeding basics reviewed  Lactation Tools Discussed/Used Tools: Shells;Coconut oil;Comfort gels Shell Type: Inverted   Consult Status Consult Status: Follow-up Date: 03/07/18 Follow-up type: In-patient    Amber Taylor 03/06/2018, 5:37 PM

## 2018-03-06 NOTE — Progress Notes (Signed)
Due to high hospital census, acuity, and limited staffing, CSW is unable to meet with MOB to complete assessment for [place] of 10.  CSW spoke with bedside nurse via telephone and PMAD education will be provided at discharge.  Bedside did not have any concerns or noted any additional psychosocial stressors.    Please consult CSW if concerns arise or by MOB's request.   There are no barriers to discharge.   Fernando Stoiber Boyd-Gilyard, MSW, LCSW Clinical Social Work [phone] 

## 2018-03-06 NOTE — Progress Notes (Signed)
Ativan 1.5mg  wasted. Raliegh Ip RN witness. Charlean Carneal D. Shellee Milo, RN 03/06/2018 5:05 AM

## 2018-03-07 ENCOUNTER — Encounter (HOSPITAL_COMMUNITY): Payer: Self-pay | Admitting: *Deleted

## 2018-03-07 LAB — BPAM RBC
BLOOD PRODUCT EXPIRATION DATE: 201910302359
BLOOD PRODUCT EXPIRATION DATE: 201910302359
Blood Product Expiration Date: 201910272359
Blood Product Expiration Date: 201910272359
UNIT TYPE AND RH: 9500
UNIT TYPE AND RH: 9500
Unit Type and Rh: 600
Unit Type and Rh: 600

## 2018-03-07 LAB — TYPE AND SCREEN
ABO/RH(D): AB NEG
ANTIBODY SCREEN: POSITIVE
UNIT DIVISION: 0
UNIT DIVISION: 0
Unit division: 0
Unit division: 0

## 2018-03-07 MED ORDER — LORAZEPAM 0.5 MG PO TABS
0.5000 mg | ORAL_TABLET | Freq: Two times a day (BID) | ORAL | Status: DC | PRN
  Administered 2018-03-07: 0.5 mg via ORAL
  Filled 2018-03-07: qty 1

## 2018-03-07 MED ORDER — IBUPROFEN 600 MG PO TABS: 600.0000 mg | ORAL_TABLET | Freq: Four times a day (QID) | ORAL | 0 refills | Status: DC

## 2018-03-07 MED ORDER — LORAZEPAM 0.5 MG PO TABS: 0.5000 mg | ORAL_TABLET | Freq: Three times a day (TID) | ORAL | 0 refills | Status: DC | PRN

## 2018-03-07 NOTE — Discharge Summary (Signed)
OB Discharge Summary     Patient Name: Amber Taylor DOB: 1981/05/14 MRN: [id number]  Date of admission: 03/04/2018 Delivering MD: Pryor Ochoa [facility]   Date of discharge: 03/07/2018  Admitting diagnosis: Previa, seizure disorder Intrauterine pregnancy: [redacted]w[redacted]d     Secondary diagnosis:  Active Problems:   Term pregnancy   Status post primary low transverse cesarean section   Postpartum care following cesarean delivery      Discharge diagnosis: Term Pregnancy Delivered and seizure disorder                                   Hospital course:  Sceduled C/S   37 y.o. yo G2P1001 at [redacted]w[redacted]d was admitted to the hospital 03/04/2018 for scheduled cesarean section with the following indication:seizure disorder.  Membrane Rupture Time/Date: 4:00 PM ,03/04/2018   Patient delivered a Viable infant.03/04/2018  Details of operation can be found in separate operative note.  [name] had an uncomplicated postpartum course.  She was kept on her Lamictal and Vimpat to prevent seizures, also used prn Ativan when she didn't feel right  She is ambulating, tolerating a regular diet, passing flatus, and urinating well. Patient is discharged home in stable condition on  03/07/18         Physical exam  Vitals:   03/05/18 2123 03/06/18 1341 03/06/18 2045 03/07/18 0607  BP: 108/67 (!) 95/50 (!) 109/56 100/61  Pulse: 72 79 70 (!) 58  Resp: 16 17 18 16   Temp: 98 F (36.7 C) 98.4 F (36.9 C) 98.2 F (36.8 C) 98.5 F (36.9 C)  TempSrc: Oral Oral Oral Oral  SpO2: 98%     Weight:      Height:       General: alert Lochia: appropriate Uterine Fundus: firm Incision: Healing well with no significant drainage  Labs: Lab Results  Component Value Date   WBC 9.2 03/05/2018   HGB 10.5 (L) 03/05/2018   HCT 30.7 (L) 03/05/2018   MCV 93.3 03/05/2018   PLT 178 03/05/2018   No flowsheet data found.  Discharge instruction: per After Visit Summary and "Baby and Me Booklet".  After visit meds:  Allergies as of  03/07/2018      Reactions   Peanut-containing Drug Products    Feels ALL NUTS contraindicates medicine triggering seizures.   Augmentin [amoxicillin-pot Clavulanate] Rash   Has patient had a PCN reaction causing immediate rash, facial/tongue/throat swelling, SOB or lightheadedness with hypotension: No Has patient had a PCN reaction causing severe rash involving mucus membranes or skin necrosis: No Has patient had a PCN reaction that required hospitalization: No Has patient had a PCN reaction occurring within the last 10 years: Yes--RASH ON LEGS ONLY If all of the above answers are "NO", then may proceed with Cephalosporin use.      Medication List    TAKE these medications   ferrous sulfate 325 (65 FE) MG EC tablet Take 325 mg by mouth at bedtime.   ibuprofen 600 MG tablet Commonly known as:  ADVIL,MOTRIN Take 1 tablet (600 mg total) by mouth every 6 (six) hours.   LAMICTAL XR 200 MG Tb24 24 hour tablet Generic drug:  LamoTRIgine Take 600-800 mg by mouth See admin instructions. TAKE 3 TABLETS (600 MG) BY MOUTH DAILY IN THE MORNING & TAKE 4 TABLETS (800 MG) BY MOUTH DAILY IN THE EVENING.   LAMICTAL 100 MG tablet Generic drug:  lamoTRIgine Take 150 mg by  mouth 2 (two) times daily.   LORazepam 0.5 MG tablet Commonly known as:  ATIVAN Take 1 tablet (0.5 mg total) by mouth every 8 (eight) hours as needed for seizure (seizure activity).   METHYLFOLATE PO Take 800 mcg by mouth at bedtime.   prenatal multivitamin Tabs tablet Take 1 tablet by mouth daily at 12 noon. What changed:  when to take this   VIMPAT 200 MG Tabs tablet Generic drug:  lacosamide Take 200 mg by mouth 2 (two) times daily.       Diet: routine diet  Activity: Advance as tolerated. Pelvic rest for 6 weeks.   Outpatient follow up:2 weeks  Newborn Data: Live born female  Birth Weight: 7 lb 11.3 oz (3495 g) APGAR: 8, 9  Newborn Delivery   Birth date/time:  03/04/2018 16:00:00 Delivery type:  C-Section,  Low Transverse Trial of labor:  No C-section categorization:  Primary     Baby Feeding: Breast Disposition:home with mother   03/07/2018 Zenaida Niece, MD

## 2018-03-07 NOTE — Progress Notes (Signed)
POD #3 Doing ok, chose to use prn Ativan rather than oxycodone, pain is tolerable, no seizures Afeb, VSS Abd- soft, fundus firm, dressing with serosanguinous drainage, incision intact Will change dressing and d/c home

## 2018-03-07 NOTE — Discharge Instructions (Signed)
As per discharge pamphlet °

## 2018-03-07 NOTE — Lactation Note (Signed)
This note was copied from a baby's chart. Lactation Consultation Note  Patient Name: Amber Taylor [id number] Date: 03/07/2018 Reason for consult: Follow-up assessment   Baby 79 hours old and in nursery so mother can sleep between feedings due to epilepsy. Mother has been pumping approx 50 ml + with DEBP. Her nipples are cracked and bleeding and states shells have not helped. Discussed APNO.  For soreness suggest mother apply ebm or coconut oil and alternate with comfort gels. Checked 24 flange size with fits appropriately. Mom encouraged to feed baby 8-12 times/24 hours and with feeding cues.  Reviewed engorgement care and monitoring voids/stools.     Maternal Data    Feeding Feeding Type: Breast Milk Nipple Type: Slow - flow  LATCH Score                   Interventions    Lactation Tools Discussed/Used Tools: Coconut oil;Comfort gels;Pump   Consult Status Consult Status: Complete Date: 03/07/18    Dahlia Byes [facility] 03/07/2018, 10:44 AM

## 2019-01-07 ENCOUNTER — Encounter (HOSPITAL_BASED_OUTPATIENT_CLINIC_OR_DEPARTMENT_OTHER): Payer: Self-pay

## 2019-01-07 ENCOUNTER — Other Ambulatory Visit: Payer: Self-pay

## 2019-01-07 ENCOUNTER — Emergency Department (HOSPITAL_BASED_OUTPATIENT_CLINIC_OR_DEPARTMENT_OTHER)
Admission: EM | Admit: 2019-01-07 | Discharge: 2019-01-07 | Disposition: A | Discharge status: Home / Self Care | Payer: Self-pay | Attending: Emergency Medicine | Admitting: Emergency Medicine

## 2019-01-07 ENCOUNTER — Emergency Department (HOSPITAL_BASED_OUTPATIENT_CLINIC_OR_DEPARTMENT_OTHER): Payer: Self-pay

## 2019-01-07 DIAGNOSIS — Z91010 Allergy to peanuts: Secondary | ICD-10-CM | POA: Insufficient documentation

## 2019-01-07 DIAGNOSIS — W19XXXA Unspecified fall, initial encounter: Secondary | ICD-10-CM | POA: Insufficient documentation

## 2019-01-07 DIAGNOSIS — S0101XA Laceration without foreign body of scalp, initial encounter: Secondary | ICD-10-CM

## 2019-01-07 DIAGNOSIS — Z79899 Other long term (current) drug therapy: Secondary | ICD-10-CM | POA: Insufficient documentation

## 2019-01-07 DIAGNOSIS — Y939 Activity, unspecified: Secondary | ICD-10-CM | POA: Insufficient documentation

## 2019-01-07 DIAGNOSIS — R569 Unspecified convulsions: Secondary | ICD-10-CM

## 2019-01-07 DIAGNOSIS — Y92009 Unspecified place in unspecified non-institutional (private) residence as the place of occurrence of the external cause: Secondary | ICD-10-CM | POA: Insufficient documentation

## 2019-01-07 DIAGNOSIS — Y999 Unspecified external cause status: Secondary | ICD-10-CM | POA: Insufficient documentation

## 2019-01-07 LAB — CBC WITH DIFFERENTIAL/PLATELET
Abs Immature Granulocytes: 0.05 10*3/uL (ref 0.00–0.07)
Basophils Absolute: 0 10*3/uL (ref 0.0–0.1)
Basophils Relative: 0 %
Eosinophils Absolute: 0.1 10*3/uL (ref 0.0–0.5)
Eosinophils Relative: 1 %
HCT: 40.1 % (ref 36.0–46.0)
Hemoglobin: 12.7 g/dL (ref 12.0–15.0)
Immature Granulocytes: 1 %
Lymphocytes Relative: 10 %
Lymphs Abs: 1.1 10*3/uL (ref 0.7–4.0)
MCH: 28.8 pg (ref 26.0–34.0)
MCHC: 31.7 g/dL (ref 30.0–36.0)
MCV: 90.9 fL (ref 80.0–100.0)
Monocytes Absolute: 0.4 10*3/uL (ref 0.1–1.0)
Monocytes Relative: 4 %
Neutro Abs: 8.9 10*3/uL — ABNORMAL HIGH (ref 1.7–7.7)
Neutrophils Relative %: 84 %
Platelets: 318 10*3/uL (ref 150–400)
RBC: 4.41 MIL/uL (ref 3.87–5.11)
RDW: 12 % (ref 11.5–15.5)
WBC: 10.5 10*3/uL (ref 4.0–10.5)
nRBC: 0 % (ref 0.0–0.2)

## 2019-01-07 LAB — URINALYSIS, ROUTINE W REFLEX MICROSCOPIC
Bilirubin Urine: NEGATIVE
Glucose, UA: NEGATIVE mg/dL
Hgb urine dipstick: NEGATIVE
Ketones, ur: NEGATIVE mg/dL
Leukocytes,Ua: NEGATIVE
Nitrite: NEGATIVE
Protein, ur: NEGATIVE mg/dL
Specific Gravity, Urine: 1.01 (ref 1.005–1.030)
pH: 7 (ref 5.0–8.0)

## 2019-01-07 LAB — BASIC METABOLIC PANEL
Anion gap: 10 (ref 5–15)
BUN: 26 mg/dL — ABNORMAL HIGH (ref 6–20)
CO2: 24 mmol/L (ref 22–32)
Calcium: 9.4 mg/dL (ref 8.9–10.3)
Chloride: 103 mmol/L (ref 98–111)
Creatinine, Ser: 0.83 mg/dL (ref 0.44–1.00)
GFR calc Af Amer: >60 mL/min (ref >60)
GFR calc non Af Amer: >60 mL/min (ref >60)
Glucose, Bld: 103 mg/dL — ABNORMAL HIGH (ref 70–99)
Potassium: 4.1 mmol/L (ref 3.5–5.1)
Sodium: 137 mmol/L (ref 135–145)

## 2019-01-07 LAB — PREGNANCY, URINE: Preg Test, Ur: NEGATIVE

## 2019-01-07 LAB — LACTIC ACID, PLASMA
Lactic Acid, Venous: 1.6 mmol/L (ref 0.5–1.9)
Lactic Acid, Venous: 1.1 mmol/L (ref 0.5–1.9)

## 2019-01-07 MED ORDER — LIDOCAINE HCL (PF) 1 % IJ SOLN
10.0000 mL | Freq: Once | INTRAMUSCULAR | Status: AC
  Administered 2019-01-07: 18:00:00 10 mL via INTRADERMAL
  Filled 2019-01-07: qty 10

## 2019-01-07 MED ORDER — SODIUM CHLORIDE 0.9 % IV BOLUS
1000.0000 mL | Freq: Once | INTRAVENOUS | Status: AC
  Administered 2019-01-07: 15:00:00 1000 mL via INTRAVENOUS

## 2019-01-07 MED ORDER — LIDOCAINE-EPINEPHRINE-TETRACAINE (LET) SOLUTION
3.0000 mL | Freq: Once | NASAL | Status: AC
  Administered 2019-01-07: 3 mL via TOPICAL
  Filled 2019-01-07: qty 3

## 2019-01-07 NOTE — ED Provider Notes (Signed)
[facility] EMERGENCY DEPARTMENT Provider Note   CSN: [id number] Arrival date & time: 01/07/19  1343    History   Chief Complaint Chief Complaint  Patient presents with   Head Injury    HPI Amber Taylor is a 38 y.o. female past medical history of seizures (on Lamictal and VIMPAT) who presents for evaluation of possible seizure activity and head injury.  Patient states that she is unsure of what exactly happened.  She was at home with her children at about 10:50 AM and noted that the nanny was going to come out approximately 11 AM.  She knows that she started feeling bad and went to lay down.  She got up and made sure that the kids were eating and then went to go back and lay down before the nanny came.  She states that the next thing she remembers is the nanny knocking at the door.  When she inserted the door, the nanny noticed that she was bleeding from the head and patient noticed that her head was hurting.  She is unsure of how much time passed in between and exactly what happened.  She does not remember falling or if she had a seizure.  Patient initially went to urgent care and was referred here for further evaluation.  She does report a history of seizure activity.  Patient reports that normally before seizures, she gets an aura where she feels like "everything is dragging her down."  Additionally, she states that after her seizures, she feels disoriented and groggy and has some photophobia.  She states that this time, she does not have any of the symptoms and she does not recall her normal aura that occurred.  She does report that she has been compliant with her medication though she does report missing a dose of her Vimpat a few days ago.  She did state that while she was breast-feeding, doses of her Vimpat were lowered to ward off side effects but that she has been on her full dosage over the last few months.  She has not been sick recently and has not noticed any fever or recent  tick bite.  She denies any chest pain, difficulty breathing, abdominal pain, nausea/vomiting, no arms or legs.  She is not currently on blood thinners.  She has been able to ambulate since the incident.  Her last Tdap was 3 years ago. She denies any OCP use, recent immobilization, prior history of DVT/PE, recent surgery, leg swelling, or long travel.  Neurologist: Dr. Manson Passey [facility])      The history is provided by the patient.    Past Medical History:  Diagnosis Date   Allergy    AMA (advanced maternal age) primigravida 35+    Complication of anesthesia    neurologist concerned about general anesthesia if needed   Eczema    Headache    Placenta previa antepartum in third trimester 01/28/2018   PLACENTA PREVIA:  General counseling was then performed regarding placenta previa.  Ninety percent of placenta previa diagnosed in the early second trimester will resolve by term.  There is an association with IUGR therefore serial U/S every 4 weeks for placental location and fetal growth is indicated.  Patients have a 4-8% risk of recurrence in subsequent pregnancies.   Polyhydramnios affecting pregnancy in third trimester 04/20/2016   Seizures (HCC)    Status post primary low transverse cesarean section 03/04/2018    Patient Active Problem List   Diagnosis Date Noted   Status  post primary low transverse cesarean section 03/04/2018   Postpartum care following cesarean delivery 03/04/2018   Term pregnancy 02/28/2018   Placenta previa antepartum in third trimester 01/28/2018   SVD (spontaneous vaginal delivery) 04/28/2016   Postpartum care following vaginal delivery 04/28/2016   Pregnancy 04/27/2016   Polyhydramnios affecting pregnancy in third trimester 04/20/2016   Advanced maternal age, 1st pregnancy 01/03/2016   Epilepsy (HCC) 08/24/2014   Viral syndrome 08/24/2014   Eczema     Past Surgical History:  Procedure Laterality Date   CESAREAN SECTION N/A  03/04/2018   Procedure: CESAREAN SECTION;  Surgeon: Edwinna AreolaBanga, Cecilia Worema, DO;  Location: [facility];  Service: Obstetrics;  Laterality: N/A;  Heather, RNFA Seizure disorder, MFM recommendation of delivery at 37wks, previa  Spoke with Misty StanleyLisa to add to this day   CHOLECYSTECTOMY     WISDOM TOOTH EXTRACTION       OB History    Gravida  2   Para  1   Term  1   Preterm      AB      Living  1     SAB      TAB      Ectopic      Multiple  0   Live Births  1            Home Medications    Prior to Admission medications   Medication Sig Start Date End Date Taking? Authorizing Provider  ferrous sulfate 325 (65 FE) MG EC tablet Take 325 mg by mouth at bedtime.    [provider]  ibuprofen (ADVIL,MOTRIN) 600 MG tablet Take 1 tablet (600 mg total) by mouth every 6 (six) hours. 03/07/18   Meisinger, Todd, MD  LAMICTAL 100 MG tablet Take 150 mg by mouth 2 (two) times daily.  04/11/16   [provider]  LAMICTAL XR 200 MG TB24 Take 600-800 mg by mouth See admin instructions. TAKE 3 TABLETS (600 MG) BY MOUTH DAILY IN THE MORNING & TAKE 4 TABLETS (800 MG) BY MOUTH DAILY IN THE EVENING. 04/04/16   [provider]  Levomefolate Glucosamine (METHYLFOLATE PO) Take 800 mcg by mouth at bedtime.    [provider]  LORazepam (ATIVAN) 0.5 MG tablet Take 1 tablet (0.5 mg total) by mouth every 8 (eight) hours as needed for seizure (seizure activity). 03/07/18   Meisinger, Tawanna Coolerodd, MD  Prenatal Vit-Fe Fumarate-FA (PRENATAL MULTIVITAMIN) TABS tablet Take 1 tablet by mouth daily at 12 noon. Patient taking differently: Take 1 tablet by mouth at bedtime.  05/01/16   Bovard-Stuckert, Jody, MD  VIMPAT 200 MG TABS tablet Take 200 mg by mouth 2 (two) times daily. 03/26/16   [provider]    Family History Family History  Problem Relation Age of Onset   Cancer Mother        breast   Cancer Maternal Aunt    Cancer Maternal Grandfather     Heart disease Paternal Grandmother     Social History Social History   Tobacco Use   Smoking status: Never Smoker   Smokeless tobacco: Never Used  Substance Use Topics   Alcohol use: Not Currently    Alcohol/week: 0.0 standard drinks   Drug use: No     Allergies   Peanut-containing drug products and Augmentin [amoxicillin-pot clavulanate]   Review of Systems Review of Systems  Constitutional: Negative for fever.  Eyes: Negative for photophobia and visual disturbance.  Respiratory: Negative for cough and shortness of breath.  Cardiovascular: Negative for chest pain.  Gastrointestinal: Negative for abdominal pain, nausea and vomiting.  Genitourinary: Negative for dysuria and hematuria.  Musculoskeletal: Positive for neck pain.  Skin: Positive for wound.  Neurological: Positive for seizures and headaches. Negative for weakness and numbness.  All other systems reviewed and are negative.    Physical Exam Updated Vital Signs BP 100/76 (BP Location: Right Arm)    Pulse 85    Temp 98 F (36.7 C) (Oral)    Resp 18    LMP 01/02/2019    SpO2 100%   Physical Exam Vitals signs and nursing note reviewed.  Constitutional:      Appearance: Normal appearance. She is well-developed.  HENT:     Head: Normocephalic and atraumatic.   Eyes:     General: Lids are normal.     Conjunctiva/sclera: Conjunctivae normal.     Pupils: Pupils are equal, round, and reactive to light.     Comments: PERRL. EOMs intact without any difficulty. No nystagmus, neglect.   Neck:     Comments: Flexion/extension intact but with some subjective reports of pain.  Lateral movement of neck intact with any difficulty.  Tenderness palpation noted to midline C-spine.  No deformity or crepitus noted. Cardiovascular:     Rate and Rhythm: Normal rate and regular rhythm.     Pulses: Normal pulses.          Radial pulses are 2+ on the right side and 2+ on the left side.       Dorsalis pedis pulses are 2+ on  the right side and 2+ on the left side.     Heart sounds: Normal heart sounds. No murmur. No friction rub. No gallop.   Pulmonary:     Effort: Pulmonary effort is normal.     Breath sounds: Normal breath sounds.  Abdominal:     Palpations: Abdomen is soft. Abdomen is not rigid.     Tenderness: There is no abdominal tenderness. There is no guarding.  Musculoskeletal: Normal range of motion.  Skin:    General: Skin is warm and dry.     Capillary Refill: Capillary refill takes less than 2 seconds.  Neurological:     Mental Status: She is alert and oriented to person, place, and time.     Comments: Cranial nerves III-XII intact Follows commands, Moves all extremities  5/5 strength to BUE and BLE  Sensation intact throughout all major nerve distributions Normal finger to nose. No dysdiadochokinesia. No pronator drift. No gait abnormalities  No slurred speech. No facial droop.   Psychiatric:        Speech: Speech normal.      ED Treatments / Results  Labs (all labs ordered are listed, but only abnormal results are displayed) Labs Reviewed  BASIC METABOLIC PANEL - Abnormal; Notable for the following components:      Result Value   Glucose, Bld 103 (*)    BUN 26 (*)    All other components within normal limits  CBC WITH DIFFERENTIAL/PLATELET - Abnormal; Notable for the following components:   Neutro Abs 8.9 (*)    All other components within normal limits  URINALYSIS, ROUTINE W REFLEX MICROSCOPIC  PREGNANCY, URINE  LACTIC ACID, PLASMA  LACTIC ACID, PLASMA    EKG EKG Interpretation  Date/Time:  Wednesday January 07 2019 15:08:00 EDT Ventricular Rate:  86 PR Interval:    QRS Duration: 97 QT Interval:  349 QTC Calculation: 418 R Axis:   95 Text Interpretation:  Sinus  rhythm Borderline right axis deviation RSR' in V1 or V2, probably normal variant Baseline wander in lead(s) V2 Confirmed by Benjiman CorePickering, Nathan [phone]) on 01/07/2019 3:22:42 PM   Radiology Ct Head Wo  Contrast  Result Date: 01/07/2019 CLINICAL DATA:  Headache and neck pain after a fall hitting back of the head laceration on the back of the head EXAM: CT HEAD WITHOUT CONTRAST; CT CERVICAL SPINE WITHOUT CONTRAST TECHNIQUE: Contiguous axial images were obtained from the base of the skull through the cervical spine without intravenous contrast. COMPARISON:  None. FINDINGS: Brain: No evidence of acute territorial infarction, hemorrhage, hydrocephalus,extra-axial collection or mass lesion/mass effect. Normal gray-white differentiation. Ventricles are normal in size and contour. Vascular: No hyperdense vessel or unexpected calcification. Skull: The skull is intact.  No fractures seen. Sinuses/Orbits: The visualized paranasal sinuses and mastoid air cells are clear. The orbits and globes intact. Other: Soft tissue swelling is seen over the posterior occiput. No soft tissue hematoma however is seen. Cervical Spine: Alignment: There is straightening of the normal cervical lordosis. Skull base and vertebrae: Visualized skull base is intact. No atlanto-occipital dissociation. Soft tissues and spinal canal: The visualized paraspinal soft tissues are unremarkable. No prevertebral soft tissue swelling is seen. The spinal canal is unremarkable, no large epidural collection or significant canal narrowing. Disc levels: Mild disc height loss and uncovertebral osteophytes are seen at C5-6, however no significant canal or neural foraminal narrowing. Upper chest: The lung apices are clear. Thoracic inlet is within normal limits. Other: None IMPRESSION: 1. No acute intracranial abnormality 2. No acute cervical spine fracture or malalignment 3. Mild soft tissue swelling seen over the posterior occiput, however no soft tissue hematoma Electronically Signed   By: Jonna ClarkBindu  Avutu M.D.   On: 01/07/2019 15:49   Ct Cervical Spine Wo Contrast  Result Date: 01/07/2019 CLINICAL DATA:  Headache and neck pain after a fall hitting back of the  head laceration on the back of the head EXAM: CT HEAD WITHOUT CONTRAST; CT CERVICAL SPINE WITHOUT CONTRAST TECHNIQUE: Contiguous axial images were obtained from the base of the skull through the cervical spine without intravenous contrast. COMPARISON:  None. FINDINGS: Brain: No evidence of acute territorial infarction, hemorrhage, hydrocephalus,extra-axial collection or mass lesion/mass effect. Normal gray-white differentiation. Ventricles are normal in size and contour. Vascular: No hyperdense vessel or unexpected calcification. Skull: The skull is intact.  No fractures seen. Sinuses/Orbits: The visualized paranasal sinuses and mastoid air cells are clear. The orbits and globes intact. Other: Soft tissue swelling is seen over the posterior occiput. No soft tissue hematoma however is seen. Cervical Spine: Alignment: There is straightening of the normal cervical lordosis. Skull base and vertebrae: Visualized skull base is intact. No atlanto-occipital dissociation. Soft tissues and spinal canal: The visualized paraspinal soft tissues are unremarkable. No prevertebral soft tissue swelling is seen. The spinal canal is unremarkable, no large epidural collection or significant canal narrowing. Disc levels: Mild disc height loss and uncovertebral osteophytes are seen at C5-6, however no significant canal or neural foraminal narrowing. Upper chest: The lung apices are clear. Thoracic inlet is within normal limits. Other: None IMPRESSION: 1. No acute intracranial abnormality 2. No acute cervical spine fracture or malalignment 3. Mild soft tissue swelling seen over the posterior occiput, however no soft tissue hematoma Electronically Signed   By: Jonna ClarkBindu  Avutu M.D.   On: 01/07/2019 15:49    Procedures .Marland Kitchen.Laceration Repair  Date/Time: 01/07/2019 5:46 PM Performed by: Maxwell CaulLayden, Scarlette Hogston A, PA-C Authorized by: Maxwell CaulLayden, Lilyanna Lunt A, PA-C  Consent:    Consent obtained:  Verbal   Consent given by:  Patient   Risks discussed:   Infection, need for additional repair, pain, poor cosmetic result and poor wound healing   Alternatives discussed:  No treatment and delayed treatment Universal protocol:    Procedure explained and questions answered to patient or proxy's satisfaction: yes     Relevant documents present and verified: yes     Test results available and properly labeled: yes     Imaging studies available: yes     Required blood products, implants, devices, and special equipment available: yes     Site/side marked: yes     Immediately prior to procedure, a time out was called: yes     Patient identity confirmed:  Verbally with patient Anesthesia (see MAR for exact dosages):    Anesthesia method:  Topical application and local infiltration   Topical anesthetic:  LET   Local anesthetic:  Lidocaine 1% w/o epi Laceration details:    Location:  Scalp   Scalp location:  R parietal   Length (cm):  2 Repair type:    Repair type:  Intermediate Pre-procedure details:    Preparation:  Patient was prepped and draped in usual sterile fashion Exploration:    Hemostasis achieved with:  Direct pressure   Wound exploration: wound explored through full range of motion     Wound extent: no foreign bodies/material noted   Treatment:    Area cleansed with:  Betadine   Amount of cleaning:  Extensive   Irrigation solution:  Sterile saline   Irrigation method:  Syringe   Visualized foreign bodies/material removed: no   Fascia repair:    Number of sutures:  4 Subcutaneous repair:    Suture size:  4-0   Suture material:  Vicryl   Suture technique:  Simple interrupted   Number of sutures:  4 Skin repair:    Repair method:  Staples   Number of staples:  5 Approximation:    Approximation:  Close Post-procedure details:    Dressing:  Non-adherent dressing   Patient tolerance of procedure:  Tolerated well, no immediate complications Comments:     Wound was thoroughly extensively irrigated.  No evidence of foreign  body.  The wound was very gaping though is only about 2 cm in length.  Given the gaping wound, the subcutaneous layer was repaired with Vicryl to help with closure.  Staples were placed over this.   (including critical care time)    Medications Ordered in ED Medications  sodium chloride 0.9 % bolus 1,000 mL (0 mLs Intravenous Stopped 01/07/19 1633)  lidocaine-EPINEPHrine-tetracaine (LET) solution (3 mLs Topical Given 01/07/19 1447)  lidocaine (PF) (XYLOCAINE) 1 % injection 10 mL (10 mLs Intradermal Given 01/07/19 1752)     Initial Impression / Assessment and Plan / ED Course  I have reviewed the triage vital signs and the nursing notes.  Pertinent labs & imaging results that were available during my care of the patient were reviewed by me and considered in my medical decision making (see chart for details).        38 year old female past medical history of seizure disorder who presents for evaluation of possible seizure activity and head injury.  Patient unclear of what happened.  Initially went to urgent care but was sent here for further evaluation. Patient is afebrile, non-toxic appearing, sitting comfortably on examination table. Vital signs reviewed and stable.  No neuro deficits noted on exam.  She does have a  small posterior head laceration with some surrounding tenderness. No overlying crepitus noted.  Additionally, has some tenderness palpation noted to the neck.  We will plan for imaging of head and neck.  Her tetanus is up-to-date.  Given that she is unclear and uncertain of what exactly happened this does not feel like her typical seizures, will obtain some basic labs, urine to ensure there is no other other etiology of her symptoms.  Patient is PERC negative and is not hypoxic or tachycardic here in the ED.  Do not suspect PE as the etiology of her symptoms.  History/physical exam not concerning for ACS etiology, meningitis.  Consider infection versus seizure versus intracranial  abnormality.   Urine pregnancy negative.  UA negative for any infectious etiology.  BMP shows normal electrolytes.  Lactic acid normal.  CBC shows no significant leukocytosis or anemia.  CT head shows no acute intracranial abnormality.  CT C-spine shows no acute cervical spine fracture.  Laceration repaired as documented above.  Patient tolerated procedure well.  Vitals are stable.  She has been able to ambulate in department any difficulty.  She is alert and oriented and able to answer all questions appropriately.  I discussed with Dr. Alvino Chapel.  Given her history of seizures, no indication for changing of doses in her medication at this time.  Additionally, no indication for a loading dose of seizure medication at this time.  I discussed with patient regarding follow-up with her neurologist for evaluation of possible seizure-like activity.  At this time, patient exhibits no emergent life-threatening condition that require further evaluation in ED or admission. Discussed patient with Dr. Alvino Chapel who is agreeable to plan. Patient had ample opportunity for questions and discussion. All patient's questions were answered with full understanding. Strict return precautions discussed. Patient expresses understanding and agreement to plan.   Portions of this note were generated with Lobby[employer]. Dictation errors may occur despite best attempts at proofreading.  Final Clinical Impressions(s) / ED Diagnoses   Final diagnoses:  Seizure [facility])  Laceration of scalp, initial encounter    ED Discharge Orders    None       Desma Mcgregor 01/07/19 2250    Davonna Belling, MD 01/07/19 2326

## 2019-01-07 NOTE — ED Notes (Addendum)
ED Provider at bedside repairing head laceration

## 2019-01-07 NOTE — Discharge Instructions (Signed)
As we discussed, continue taking your seizure medications.  Follow-up with your neurologist regarding your episode today in visit to the ED.  Keep the wound clean and dry for the first 24 hours. After that you may gently clean the wound with soap and water. Make sure to pat dry the wound before covering it with any dressing. You can use topical antibiotic ointment and bandage. Ice and elevate for pain relief.   You can take Tylenol or Ibuprofen as directed for pain. You can alternate Tylenol and Ibuprofen every 4 hours for additional pain relief.   Return to the Emergency Department, your primary care doctor, or the [facility] [place] in 5-7 days for suture removal.   Monitor closely for any signs of infection. Return to the Emergency Department for any worsening redness/swelling of the area that begins to spread, drainage from the site, worsening pain, fever or any other worsening or concerning symptoms.   Return the emergency department for any vomiting, numbness/weakness of arms or legs, fevers or any other worsening or concerning symptoms.

## 2019-01-07 NOTE — ED Notes (Signed)
Lidocaine at bedside for EDP 

## 2019-01-07 NOTE — ED Triage Notes (Signed)
Pt states she had a seizure ~1050am and struck back of head-sent from [facility]-steady gait

## 2019-01-07 NOTE — ED Notes (Signed)
Patient transported to CT 

## 2019-05-19 ENCOUNTER — Encounter: Payer: Self-pay | Admitting: Psychiatry

## 2019-05-19 ENCOUNTER — Ambulatory Visit (INDEPENDENT_AMBULATORY_CARE_PROVIDER_SITE_OTHER): Payer: Self-pay | Admitting: Psychiatry

## 2019-05-19 ENCOUNTER — Other Ambulatory Visit: Payer: Self-pay

## 2019-05-19 DIAGNOSIS — F4310 Post-traumatic stress disorder, unspecified: Secondary | ICD-10-CM
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
## 2019-06-04 ENCOUNTER — Inpatient Hospital Stay (HOSPITAL_COMMUNITY): Payer: Self-pay

## 2019-06-04 ENCOUNTER — Encounter (HOSPITAL_COMMUNITY): Payer: Self-pay | Admitting: Obstetrics and Gynecology

## 2019-06-04 ENCOUNTER — Ambulatory Visit (HOSPITAL_COMMUNITY)
Admission: AD | Admit: 2019-06-04 | Discharge: 2019-06-05 | Disposition: A | Discharge status: Home / Self Care | Payer: Self-pay | Source: Ambulatory Visit | Attending: Obstetrics and Gynecology | Admitting: Obstetrics and Gynecology

## 2019-06-04 ENCOUNTER — Other Ambulatory Visit: Payer: Self-pay

## 2019-06-04 DIAGNOSIS — Z20828 Contact with and (suspected) exposure to other viral communicable diseases: Secondary | ICD-10-CM | POA: Insufficient documentation

## 2019-06-04 DIAGNOSIS — O00109 Unspecified tubal pregnancy without intrauterine pregnancy: Secondary | ICD-10-CM | POA: Diagnosis present

## 2019-06-04 DIAGNOSIS — O26892 Other specified pregnancy related conditions, second trimester: Secondary | ICD-10-CM

## 2019-06-04 DIAGNOSIS — O26891 Other specified pregnancy related conditions, first trimester: Secondary | ICD-10-CM

## 2019-06-04 DIAGNOSIS — O00102 Left tubal pregnancy without intrauterine pregnancy: Secondary | ICD-10-CM | POA: Insufficient documentation

## 2019-06-04 DIAGNOSIS — Z9079 Acquired absence of other genital organ(s): Secondary | ICD-10-CM

## 2019-06-04 DIAGNOSIS — Z79899 Other long term (current) drug therapy: Secondary | ICD-10-CM | POA: Insufficient documentation

## 2019-06-04 DIAGNOSIS — G40909 Epilepsy, unspecified, not intractable, without status epilepticus: Secondary | ICD-10-CM | POA: Insufficient documentation

## 2019-06-04 DIAGNOSIS — R109 Unspecified abdominal pain: Secondary | ICD-10-CM

## 2019-06-04 DIAGNOSIS — Z3A26 26 weeks gestation of pregnancy: Secondary | ICD-10-CM

## 2019-06-04 DIAGNOSIS — Z880 Allergy status to penicillin: Secondary | ICD-10-CM | POA: Insufficient documentation

## 2019-06-04 LAB — CBC
HCT: 35.2 % — ABNORMAL LOW (ref 36.0–46.0)
Hemoglobin: 11.6 g/dL — ABNORMAL LOW (ref 12.0–15.0)
MCH: 29.1 pg (ref 26.0–34.0)
MCHC: 33 g/dL (ref 30.0–36.0)
MCV: 88.4 fL (ref 80.0–100.0)
Platelets: 262 10*3/uL (ref 150–400)
RBC: 3.98 MIL/uL (ref 3.87–5.11)
RDW: 11.9 % (ref 11.5–15.5)
WBC: 8.1 10*3/uL (ref 4.0–10.5)
nRBC: 0 % (ref 0.0–0.2)

## 2019-06-04 LAB — URINALYSIS, ROUTINE W REFLEX MICROSCOPIC
Bilirubin Urine: NEGATIVE
Glucose, UA: NEGATIVE mg/dL
Hgb urine dipstick: NEGATIVE
Ketones, ur: NEGATIVE mg/dL
Nitrite: NEGATIVE
Protein, ur: NEGATIVE mg/dL
Specific Gravity, Urine: 1.005 (ref 1.005–1.030)
pH: 7 (ref 5.0–8.0)

## 2019-06-04 LAB — WET PREP, GENITAL
Clue Cells Wet Prep HPF POC: NONE SEEN
Sperm: NONE SEEN
Trich, Wet Prep: NONE SEEN
Yeast Wet Prep HPF POC: NONE SEEN

## 2019-06-04 LAB — POCT PREGNANCY, URINE: Preg Test, Ur: POSITIVE — AB

## 2019-06-04 NOTE — MAU Note (Addendum)
Severe abdominal pain for a wk. Pain went away and then I realized I  had missed a period. Took 3 upts and all positive. Last night cramping started again and got really bad this afternoon. Denies vag bleeding. Some nausea. LMP around 04/19/19. Pt has hx seizures well controlled on meds but folic acid decreases effectiveness and pt has to increase her dosage with pregnancy. Had mild seizure on Monday

## 2019-06-05 ENCOUNTER — Encounter (HOSPITAL_COMMUNITY): Payer: Self-pay | Admitting: Obstetrics and Gynecology

## 2019-06-05 ENCOUNTER — Inpatient Hospital Stay (HOSPITAL_COMMUNITY): Payer: Self-pay | Admitting: Certified Registered Nurse Anesthetist

## 2019-06-05 ENCOUNTER — Encounter (HOSPITAL_COMMUNITY)
Admission: AD | Disposition: A | Discharge status: Home / Self Care | Payer: Self-pay | Source: Ambulatory Visit | Attending: Obstetrics and Gynecology

## 2019-06-05 DIAGNOSIS — Z9079 Acquired absence of other genital organ(s): Secondary | ICD-10-CM

## 2019-06-05 DIAGNOSIS — O00109 Unspecified tubal pregnancy without intrauterine pregnancy: Secondary | ICD-10-CM

## 2019-06-05 HISTORY — PX: LAPAROSCOPY: SHX197

## 2019-06-05 HISTORY — DX: Unspecified tubal pregnancy without intrauterine pregnancy: O00.109

## 2019-06-05 HISTORY — PX: LAPAROSCOPIC UNILATERAL SALPINGECTOMY: SHX5934

## 2019-06-05 LAB — TYPE AND SCREEN
ABO/RH(D): AB NEG
Antibody Screen: NEGATIVE

## 2019-06-05 LAB — COMPREHENSIVE METABOLIC PANEL
ALT: 15 U/L (ref 0–44)
AST: 15 U/L (ref 15–41)
Albumin: 4.1 g/dL (ref 3.5–5.0)
Alkaline Phosphatase: 44 U/L (ref 38–126)
Anion gap: 9 (ref 5–15)
BUN: 10 mg/dL (ref 6–20)
CO2: 25 mmol/L (ref 22–32)
Calcium: 9 mg/dL (ref 8.9–10.3)
Chloride: 105 mmol/L (ref 98–111)
Creatinine, Ser: 0.69 mg/dL (ref 0.44–1.00)
GFR calc Af Amer: >60 mL/min (ref >60)
GFR calc non Af Amer: >60 mL/min (ref >60)
Glucose, Bld: 99 mg/dL (ref 70–99)
Potassium: 3.9 mmol/L (ref 3.5–5.1)
Sodium: 139 mmol/L (ref 135–145)
Total Bilirubin: 0.3 mg/dL (ref 0.3–1.2)
Total Protein: 7.2 g/dL (ref 6.5–8.1)

## 2019-06-05 LAB — CBC WITH DIFFERENTIAL/PLATELET
Abs Immature Granulocytes: 0.02 10*3/uL (ref 0.00–0.07)
Basophils Absolute: 0.1 10*3/uL (ref 0.0–0.1)
Basophils Relative: 1 %
Eosinophils Absolute: 0.2 10*3/uL (ref 0.0–0.5)
Eosinophils Relative: 2 %
HCT: 35.1 % — ABNORMAL LOW (ref 36.0–46.0)
Hemoglobin: 11.6 g/dL — ABNORMAL LOW (ref 12.0–15.0)
Immature Granulocytes: 0 %
Lymphocytes Relative: 29 %
Lymphs Abs: 2.4 10*3/uL (ref 0.7–4.0)
MCH: 29.4 pg (ref 26.0–34.0)
MCHC: 33 g/dL (ref 30.0–36.0)
MCV: 88.9 fL (ref 80.0–100.0)
Monocytes Absolute: 0.5 10*3/uL (ref 0.1–1.0)
Monocytes Relative: 6 %
Neutro Abs: 5 10*3/uL (ref 1.7–7.7)
Neutrophils Relative %: 62 %
Platelets: 272 10*3/uL (ref 150–400)
RBC: 3.95 MIL/uL (ref 3.87–5.11)
RDW: 12.1 % (ref 11.5–15.5)
WBC: 8.1 10*3/uL (ref 4.0–10.5)
nRBC: 0 % (ref 0.0–0.2)

## 2019-06-05 LAB — RESPIRATORY PANEL BY RT PCR (FLU A&B, COVID)
Influenza A by PCR: NEGATIVE
Influenza B by PCR: NEGATIVE
SARS Coronavirus 2 by RT PCR: NEGATIVE

## 2019-06-05 LAB — HCG, QUANTITATIVE, PREGNANCY: hCG, Beta Chain, Quant, S: 16880 m[IU]/mL — ABNORMAL HIGH (ref <5)

## 2019-06-05 LAB — ABO/RH: ABO/RH(D): AB NEG

## 2019-06-05 SURGERY — LAPAROSCOPY OPERATIVE
Anesthesia: General

## 2019-06-05 MED ORDER — PROPOFOL 10 MG/ML IV BOLUS
INTRAVENOUS | Status: DC | PRN
  Administered 2019-06-05: 150 mg via INTRAVENOUS

## 2019-06-05 MED ORDER — SUCCINYLCHOLINE CHLORIDE 200 MG/10ML IV SOSY
PREFILLED_SYRINGE | INTRAVENOUS | Status: AC
  Filled 2019-06-05: qty 10

## 2019-06-05 MED ORDER — MIDAZOLAM HCL 2 MG/2ML IJ SOLN: 0.5000 mg | Freq: Once | INTRAMUSCULAR | Status: DC | PRN

## 2019-06-05 MED ORDER — BUPIVACAINE HCL (PF) 0.25 % IJ SOLN
INTRAMUSCULAR | Status: DC | PRN
  Administered 2019-06-05: 13 mL

## 2019-06-05 MED ORDER — MIDAZOLAM HCL 2 MG/2ML IJ SOLN
INTRAMUSCULAR | Status: AC
  Filled 2019-06-05: qty 2

## 2019-06-05 MED ORDER — ROCURONIUM BROMIDE 100 MG/10ML IV SOLN
INTRAVENOUS | Status: DC | PRN
  Administered 2019-06-05: 50 mg via INTRAVENOUS
  Administered 2019-06-05: 10 mg via INTRAVENOUS

## 2019-06-05 MED ORDER — 0.9 % SODIUM CHLORIDE (POUR BTL) OPTIME
TOPICAL | Status: DC | PRN
  Administered 2019-06-05: 1000 mL

## 2019-06-05 MED ORDER — HYDROMORPHONE HCL 1 MG/ML IJ SOLN
INTRAMUSCULAR | Status: AC
  Filled 2019-06-05: qty 1

## 2019-06-05 MED ORDER — DEXAMETHASONE SODIUM PHOSPHATE 10 MG/ML IJ SOLN
INTRAMUSCULAR | Status: DC | PRN
  Administered 2019-06-05: 10 mg via INTRAVENOUS

## 2019-06-05 MED ORDER — EPHEDRINE 5 MG/ML INJ
INTRAVENOUS | Status: AC
  Filled 2019-06-05: qty 10

## 2019-06-05 MED ORDER — IBUPROFEN 600 MG PO TABS: 600.0000 mg | ORAL_TABLET | Freq: Four times a day (QID) | ORAL | 1 refills | Status: DC | PRN

## 2019-06-05 MED ORDER — PROPOFOL 10 MG/ML IV BOLUS
INTRAVENOUS | Status: AC
  Filled 2019-06-05: qty 20

## 2019-06-05 MED ORDER — LIDOCAINE 2% (20 MG/ML) 5 ML SYRINGE
INTRAMUSCULAR | Status: AC
  Filled 2019-06-05: qty 5

## 2019-06-05 MED ORDER — EPHEDRINE SULFATE 50 MG/ML IJ SOLN
INTRAMUSCULAR | Status: DC | PRN
  Administered 2019-06-05: 5 mg via INTRAVENOUS

## 2019-06-05 MED ORDER — PHENYLEPHRINE HCL (PRESSORS) 10 MG/ML IV SOLN
INTRAVENOUS | Status: DC | PRN
  Administered 2019-06-05 (×5): 80 ug via INTRAVENOUS

## 2019-06-05 MED ORDER — LIDOCAINE HCL (CARDIAC) PF 100 MG/5ML IV SOSY
PREFILLED_SYRINGE | INTRAVENOUS | Status: DC | PRN
  Administered 2019-06-05: 40 mg via INTRAVENOUS

## 2019-06-05 MED ORDER — DEXAMETHASONE SODIUM PHOSPHATE 10 MG/ML IJ SOLN
INTRAMUSCULAR | Status: AC
  Filled 2019-06-05: qty 1

## 2019-06-05 MED ORDER — OXYCODONE HCL 5 MG PO TABS: ORAL_TABLET | ORAL | 0 refills | Status: DC

## 2019-06-05 MED ORDER — FENTANYL CITRATE (PF) 100 MCG/2ML IJ SOLN
INTRAMUSCULAR | Status: DC | PRN
  Administered 2019-06-05 (×3): 50 ug via INTRAVENOUS
  Administered 2019-06-05: 100 ug via INTRAVENOUS

## 2019-06-05 MED ORDER — MIDAZOLAM HCL 5 MG/5ML IJ SOLN
INTRAMUSCULAR | Status: DC | PRN
  Administered 2019-06-05: 2 mg via INTRAVENOUS

## 2019-06-05 MED ORDER — MEPERIDINE HCL 25 MG/ML IJ SOLN: 6.2500 mg | INTRAMUSCULAR | Status: DC | PRN

## 2019-06-05 MED ORDER — FENTANYL CITRATE (PF) 250 MCG/5ML IJ SOLN
INTRAMUSCULAR | Status: AC
  Filled 2019-06-05: qty 5

## 2019-06-05 MED ORDER — SUGAMMADEX SODIUM 200 MG/2ML IV SOLN
INTRAVENOUS | Status: DC | PRN
  Administered 2019-06-05: 200 mg via INTRAVENOUS

## 2019-06-05 MED ORDER — PHENYLEPHRINE 40 MCG/ML (10ML) SYRINGE FOR IV PUSH (FOR BLOOD PRESSURE SUPPORT)
PREFILLED_SYRINGE | INTRAVENOUS | Status: AC
  Filled 2019-06-05: qty 10

## 2019-06-05 MED ORDER — HYDROMORPHONE HCL 1 MG/ML IJ SOLN
0.2500 mg | INTRAMUSCULAR | Status: DC | PRN
  Administered 2019-06-05 (×3): 0.25 mg via INTRAVENOUS

## 2019-06-05 MED ORDER — SUCCINYLCHOLINE CHLORIDE 20 MG/ML IJ SOLN
INTRAMUSCULAR | Status: DC | PRN
  Administered 2019-06-05: 120 mg via INTRAVENOUS

## 2019-06-05 MED ORDER — BUPIVACAINE HCL (PF) 0.5 % IJ SOLN
INTRAMUSCULAR | Status: AC
  Filled 2019-06-05: qty 30

## 2019-06-05 MED ORDER — ROCURONIUM BROMIDE 10 MG/ML (PF) SYRINGE
PREFILLED_SYRINGE | INTRAVENOUS | Status: AC
  Filled 2019-06-05: qty 10

## 2019-06-05 MED ORDER — PROMETHAZINE HCL 25 MG/ML IJ SOLN: 6.2500 mg | INTRAMUSCULAR | Status: DC | PRN

## 2019-06-05 MED ORDER — OXYCODONE HCL 5 MG PO TABS
5.0000 mg | ORAL_TABLET | Freq: Once | ORAL | Status: AC
  Administered 2019-06-05: 5 mg via ORAL

## 2019-06-05 MED ORDER — OXYCODONE HCL 5 MG PO TABS
ORAL_TABLET | ORAL | Status: AC
  Filled 2019-06-05: qty 1

## 2019-06-05 MED ORDER — SODIUM CHLORIDE 0.9 % IR SOLN
Status: DC | PRN
  Administered 2019-06-05: 3000 mL

## 2019-06-05 MED ORDER — SCOPOLAMINE 1 MG/3DAYS TD PT72
MEDICATED_PATCH | TRANSDERMAL | Status: DC | PRN
  Administered 2019-06-05: 1 via TRANSDERMAL

## 2019-06-05 MED ORDER — ONDANSETRON HCL 4 MG/2ML IJ SOLN
INTRAMUSCULAR | Status: AC
  Filled 2019-06-05: qty 2

## 2019-06-05 MED ORDER — ONDANSETRON HCL 4 MG/2ML IJ SOLN
INTRAMUSCULAR | Status: DC | PRN
  Administered 2019-06-05: 4 mg via INTRAVENOUS

## 2019-06-05 MED ORDER — LACTATED RINGERS IV SOLN: INTRAVENOUS | Status: DC

## 2019-06-05 SURGICAL SUPPLY — 30 items
CABLE HIGH FREQUENCY MONO STRZ (ELECTRODE) IMPLANT
COVER WAND RF STERILE (DRAPES) ×3 IMPLANT
DERMABOND ADVANCED (GAUZE/BANDAGES/DRESSINGS) ×1
DERMABOND ADVANCED .7 DNX12 (GAUZE/BANDAGES/DRESSINGS) ×2 IMPLANT
DRSG OPSITE POSTOP 3X4 (GAUZE/BANDAGES/DRESSINGS) ×3 IMPLANT
DURAPREP 26ML APPLICATOR (WOUND CARE) ×3 IMPLANT
GLOVE BIO SURGEON STRL SZ 6.5 (GLOVE) ×3 IMPLANT
GLOVE BIOGEL PI IND STRL 7.0 (GLOVE) ×2 IMPLANT
GLOVE BIOGEL PI INDICATOR 7.0 (GLOVE) ×1
GOWN STRL REUS W/ TWL LRG LVL3 (GOWN DISPOSABLE) ×4 IMPLANT
GOWN STRL REUS W/TWL LRG LVL3 (GOWN DISPOSABLE) ×2
KIT TURNOVER KIT B (KITS) ×3 IMPLANT
NEEDLE INSUFFLATION 14GA 120MM (NEEDLE) ×3 IMPLANT
NS IRRIG 1000ML POUR BTL (IV SOLUTION) ×3 IMPLANT
PACK LAPAROSCOPY BASIN (CUSTOM PROCEDURE TRAY) ×3 IMPLANT
PACK TRENDGUARD 450 HYBRID PRO (MISCELLANEOUS) ×2 IMPLANT
POUCH SPECIMEN RETRIEVAL 10MM (ENDOMECHANICALS) ×3 IMPLANT
PROTECTOR NERVE ULNAR (MISCELLANEOUS) ×6 IMPLANT
SET IRRIG TUBING LAPAROSCOPIC (IRRIGATION / IRRIGATOR) ×3 IMPLANT
SET TUBE SMOKE EVAC HIGH FLOW (TUBING) ×3 IMPLANT
SHEARS HARMONIC ACE PLUS 36CM (ENDOMECHANICALS) ×3 IMPLANT
SLEEVE ENDOPATH XCEL 5M (ENDOMECHANICALS) ×3 IMPLANT
SUT VICRYL 0 UR6 27IN ABS (SUTURE) ×3 IMPLANT
SUT VICRYL 4-0 PS2 18IN ABS (SUTURE) ×3 IMPLANT
TOWEL GREEN STERILE FF (TOWEL DISPOSABLE) ×6 IMPLANT
TRAY FOLEY BAG SILVER LF 14FR (CATHETERS) ×3 IMPLANT
TRENDGUARD 450 HYBRID PRO PACK (MISCELLANEOUS) ×3
TROCAR XCEL NON-BLD 11X100MML (ENDOMECHANICALS) IMPLANT
TROCAR XCEL NON-BLD 5MMX100MML (ENDOMECHANICALS) ×3 IMPLANT
WARMER LAPAROSCOPE (MISCELLANEOUS) ×3 IMPLANT

## 2019-06-05 NOTE — Anesthesia Postprocedure Evaluation (Signed)
Anesthesia Post Note  Patient: Therapist, nutritional  Procedure(s) Performed: LAPAROSCOPY OPERATIVE (N/A ) LAPAROSCOPIC LEFT  SALPINGECTOMY WITH REMOVAL OF ECTOPIC PREGNANCY (Left )     Patient location during evaluation: PACU Anesthesia Type: General Level of consciousness: awake and alert, patient cooperative and oriented Pain management: pain level controlled Vital Signs Assessment: post-procedure vital signs reviewed and stable Respiratory status: spontaneous breathing, nonlabored ventilation and respiratory function stable Cardiovascular status: blood pressure returned to baseline and stable Postop Assessment: no apparent nausea or vomiting Anesthetic complications: no    Last Vitals:  Vitals:   06/05/19 0630 06/05/19 0645  BP: 108/70   Pulse: 71 68  Resp: 14 11  Temp: 36.5 C   SpO2: 100% 98%    Last Pain:  Vitals:   06/05/19 0645  PainSc: 3                  Alexarae Oliva,E. Audriella Blakeley

## 2019-06-05 NOTE — Op Note (Signed)
Amber Taylor, Amber Taylor MEDICAL RECORD [id number] ACCOUNT [id number] DATE OF BIRTH:11/20/1980 FACILITY: [facility] LOCATION: [facility] PHYSICIAN:Ronica Vivian BOVARD-STUCKERT, MD  OPERATIVE REPORT  DATE OF PROCEDURE:  06/05/2019  POSTOPERATIVE DIAGNOSES:  Ectopic pregnancy.  POSTOPERATIVE DIAGNOSIS:  Ectopic pregnancy.  PROCEDURE:  Operative laparoscopy, left salpingectomy with removal of ectopic pregnancy.  SURGEON:  Janyth Contes, MD  ASSISTANT:  None.  ANESTHESIA:  General.  ESTIMATED BLOOD LOSS:  50 mL.  INTRAVENOUS FLUIDS AND URINE OUTPUT:  Per anesthesia.  FINDINGS:  Normal uterus, normal bilateral ovaries, normal right tube, left tube swollen with ectopic pregnancy.  Normal appendix was also seen.  COMPLICATIONS:  None.  PATHOLOGY:  Left tube to pathology.  DESCRIPTION OF PROCEDURE:  After informed consent was reviewed with the patient including risks, benefits and alternatives of the surgical procedure, she was transported to the OR, placed on the table in supine position.  General anesthesia was induced  and found to be adequate.  She was then placed in the [place] stirrups, prepped and draped in the normal sterile fashion.  After an appropriate timeout, a Hulka manipulator was placed into her uterus with the aid of an open-sided speculum and a  single-tooth tenaculum.  Gloves and gown were changed.  Attention was turned to the abdominal portion of the case.  Approximately 1 cm infraumbilical incision was made.  The underlying layer of fascia was grasped with Kocher clamps, elevated and incised.   The peritoneum was then entered bluntly and a Hasson trocar was placed after the fascial sutures were placed.  With the trocar placed, the visualization was as above.  Accessory ports were placed on both the right and left under direct visualization  after Marcaine was placed in the skin.  Using an atraumatic grasper and Harmonic scalpel was used to excise the left tube.  It was  placed in an EndoCatch bag and removed.  Pelvic survey performed revealed hemostasis and again the above-mentioned anatomy.   The trocars were removed under direct visualization.  The pneumoperitoneum was evacuated using the held suture.  The fascia was closed with 0 Vicryl and 4-0 was used subcuticularly and Dermabond was applied.  The patient tolerated the procedure well.   Sponge, lap and needle counts were correct x2 at the end of the procedure.  The Hulka manipulator was also removed from her cervix.  JN/NUANCE  D:06/05/2019 T:06/05/2019 JOB:[id number]

## 2019-06-05 NOTE — Discharge Instructions (Signed)

## 2019-06-05 NOTE — Anesthesia Preprocedure Evaluation (Addendum)
Anesthesia Evaluation  Patient identified by MRN, date of birth, ID band Patient awake    Reviewed: Allergy & Precautions, NPO status , Patient's Chart, lab work & pertinent test results  History of Anesthesia Complications Negative for: history of anesthetic complications  Airway Mallampati: I  TM Distance: >3 FB Neck ROM: Full    Dental  (+) Dental Advisory Given, Teeth Intact   Pulmonary neg pulmonary ROS,  06/05/2019 SARS CoV2 NEG   breath sounds clear to auscultation       Cardiovascular negative cardio ROS   Rhythm:Regular Rate:Normal     Neuro/Psych  Headaches, Seizures -, Well Controlled,  negative psych ROS   GI/Hepatic Neg liver ROS, Nausea with this ectopic   Endo/Other  negative endocrine ROS  Renal/GU negative Renal ROS     Musculoskeletal   Abdominal   Peds  Hematology  (+) Blood dyscrasia (Hb 11.6), anemia ,   Anesthesia Other Findings   Reproductive/Obstetrics (+) Pregnancy (ectopic)                            Anesthesia Physical Anesthesia Plan  ASA: II and emergent  Anesthesia Plan: General   Post-op Pain Management:    Induction: Intravenous and Rapid sequence  PONV Risk Score and Plan: 3 and Ondansetron, Dexamethasone and Scopolamine patch - Pre-op  Airway Management Planned: Oral ETT  Additional Equipment:   Intra-op Plan:   Post-operative Plan: Extubation in OR  Informed Consent: I have reviewed the patients History and Physical, chart, labs and discussed the procedure including the risks, benefits and alternatives for the proposed anesthesia with the patient or authorized representative who has indicated his/her understanding and acceptance.     Dental advisory given  Plan Discussed with: CRNA and Surgeon  Anesthesia Plan Comments:        Anesthesia Quick Evaluation

## 2019-06-05 NOTE — Brief Op Note (Signed)
06/05/2019  6:19 AM  PATIENT:  Amber Taylor  38 y.o. female  PRE-OPERATIVE DIAGNOSIS:  Ectopic Pregnancy  POST-OPERATIVE DIAGNOSIS:  Ectopic Pregnancy  PROCEDURE:  Procedure(s): LAPAROSCOPY OPERATIVE (N/A) LAPAROSCOPIC LEFT  SALPINGECTOMY WITH REMOVAL OF ECTOPIC PREGNANCY (Left)  SURGEON:  Surgeon(s) and Role:    * Bovard-Stuckert, Karmine Kauer, MD - Primary  ANESTHESIA:   general  EBL:  50 mL IVF and uop per anesthesia  FINDINGS: nl uterus, nl B nl B ovaties, nl R tube, L tube swollen w ectopic pregnancy  BLOOD ADMINISTERED:none  DRAINS: none   LOCAL MEDICATIONS USED:  MARCAINE     SPECIMEN:  Source of Specimen:  L tube  DISPOSITION OF SPECIMEN:  PATHOLOGY  COUNTS:  YES  TOURNIQUET:  * No tourniquets in log *  DICTATION: .Other Dictation: Dictation Number [id number]  PLAN OF CARE: Discharge to home after PACU  PATIENT DISPOSITION:  PACU - hemodynamically stable.   Delay start of Pharmacological VTE agent (>24hrs) due to surgical blood loss or risk of bleeding: not applicable

## 2019-06-05 NOTE — Transfer of Care (Signed)
Immediate Anesthesia Transfer of Care Note  Patient: Letta Kocher  Procedure(s) Performed: LAPAROSCOPY OPERATIVE (N/A ) LAPAROSCOPIC LEFT  SALPINGECTOMY WITH REMOVAL OF ECTOPIC PREGNANCY (Left )  Patient Location: PACU  Anesthesia Type:General  Level of Consciousness: awake, alert  and oriented  Airway & Oxygen Therapy: Patient Spontanous Breathing and Patient connected to nasal cannula oxygen  Post-op Assessment: Report given to RN, Post -op Vital signs reviewed and stable and Patient moving all extremities  Post vital signs: Reviewed and stable  Last Vitals:  Vitals Value Taken Time  BP 115/78 06/05/19 0522  Temp    Pulse 90 06/05/19 0526  Resp 19 06/05/19 0526  SpO2 100 % 06/05/19 0526  Vitals shown include unvalidated device data.  Last Pain:  Vitals:   06/04/19 2239  PainSc: 3       Patients Stated Pain Goal: 0 (79/39/03 0092)  Complications: No apparent anesthesia complications

## 2019-06-05 NOTE — H&P (Signed)
Amber MurrayHeidi Whittaker is an 38 y.o. female G3P2002 at 6+5 with ectopic pregnancy.  Also with seizure disorder, increases with pregnancy.  On 3-4 occasions has had severe cramping that brought her to her knees.  Has had + UPT x 3, Has had nausea.  Increased pain today.  Advised to come for evaluation.  On US ectopic pregnancy on left with FHTs.  D/w pt r/b/a of surgery.  D/W pt ectopic and treatment w likely salpingectomy  Pertinent Gynecological History: No abn pap No std OB History: G3, P2002 SVD 37wk female, LTCS 37wk female   Diaphragm for contraception  Menstrual History:  Patient's last menstrual period was 04/19/2019.    Past Medical History:  Diagnosis Date  . Allergy   . AMA (advanced maternal age) primigravida 35+   . Complication of anesthesia    neurologist concerned about general anesthesia if needed  . Eczema   . Headache   . Placenta previa antepartum in third trimester 01/28/2018   PLACENTA PREVIA:  General counseling was then performed regarding placenta previa.  Ninety percent of placenta previa diagnosed in the early second trimester will resolve by term.  There is an association with IUGR therefore serial U/S every 4 weeks for placental location and fetal growth is indicated.  Patients have a 4-8% risk of recurrence in subsequent pregnancies.  . Polyhydramnios affecting pregnancy in third trimester 04/20/2016  . Seizures (HCC)   . Status post primary low transverse cesarean section 03/04/2018    Past Surgical History:  Procedure Laterality Date  . CESAREAN SECTION N/A 03/04/2018   Procedure: CESAREAN SECTION;  Surgeon: Edwinna AreolaBanga, Cecilia Worema, DO;  Location: [facility];  Service: Obstetrics;  Laterality: N/A;  Heather, RNFA Seizure disorder, MFM recommendation of delivery at 37wks, previa  Spoke with Misty StanleyLisa to add to this day  . CHOLECYSTECTOMY    . WISDOM TOOTH EXTRACTION    Tonsillectomy  Family History  Problem Relation Age of Onset  . Cancer Mother         breast  . Cancer Maternal Aunt   . Cancer Maternal Grandfather   . Heart disease Paternal Grandmother     Social History:  reports that she has never smoked. She has never used smokeless tobacco. She reports previous alcohol use. She reports that she does not use drugs.  [facility], married  Allergies:  Allergies  Allergen Reactions  . Peanut-Containing Drug Products     Feels ALL NUTS contraindicates medicine triggering seizures.  . Augmentin [Amoxicillin-Pot Clavulanate] Rash    Has patient had a PCN reaction causing immediate rash, facial/tongue/throat swelling, SOB or lightheadedness with hypotension: No Has patient had a PCN reaction causing severe rash involving mucus membranes or skin necrosis: No Has patient had a PCN reaction that required hospitalization: No Has patient had a PCN reaction occurring within the last 10 years: Yes--RASH ON LEGS ONLY If all of the above answers are "NO", then may proceed with Cephalosporin use.     Medications Prior to Admission  Medication Sig Dispense Refill Last Dose  . ferrous sulfate 325 (65 FE) MG EC tablet Take 325 mg by mouth at bedtime.     Marland Kitchen. ibuprofen (ADVIL,MOTRIN) 600 MG tablet Take 1 tablet (600 mg total) by mouth every 6 (six) hours. 30 tablet 0   . LAMICTAL 100 MG tablet Take 100 mg by mouth every morning.   6   . LAMICTAL XR 200 MG TB24 Take 600 mg by mouth at bedtime.   6   .  Levomefolate Glucosamine (METHYLFOLATE PO) Take 800 mcg by mouth at bedtime.     Marland Kitchen LORazepam (ATIVAN) 0.5 MG tablet Take 1 tablet (0.5 mg total) by mouth every 8 (eight) hours as needed for seizure (seizure activity). 15 tablet 0   . Prenatal Vit-Fe Fumarate-FA (PRENATAL MULTIVITAMIN) TABS tablet Take 1 tablet by mouth daily at 12 noon. (Patient taking differently: Take 1 tablet by mouth at bedtime. ) 100 tablet 3   . VIMPAT 200 MG TABS tablet Take 200 mg by mouth 2 (two) times daily.  5     Review of Systems  Constitutional: Negative.   HENT: Negative.    Eyes: Negative.   Respiratory: Negative.   Cardiovascular: Negative.   Gastrointestinal: Negative.   Genitourinary: Positive for pelvic pain.       Intermittent cramping pain  Musculoskeletal: Negative.   Skin: Positive for rash.       Eczema on hands  Neurological: Negative.   Psychiatric/Behavioral: Negative.     Blood pressure 107/64, pulse 78, temperature 97.7 F (36.5 C), resp. rate 16, height 5\' 8"  (1.727 m), weight 60.3 kg, last menstrual period 04/19/2019, unknown if currently breastfeeding. Physical Exam  Constitutional: She is oriented to person, place, and time. She appears well-developed and well-nourished.  HENT:  Head: Normocephalic and atraumatic.  Cardiovascular: Normal rate and regular rhythm.  Respiratory: Breath sounds normal. No respiratory distress. She has no wheezes.  GI: Soft. Bowel sounds are normal. She exhibits no distension. There is abdominal tenderness. There is rebound and guarding.  Musculoskeletal:        General: Normal range of motion.  Neurological: She is alert and oriented to person, place, and time.  Skin: Skin is warm and dry.  Psychiatric: She has a normal mood and affect. Her behavior is normal.    Results for orders placed or performed during the hospital encounter of 06/04/19 (from the past 24 hour(s))  Urinalysis, Routine w reflex microscopic     Status: Abnormal   Collection Time: 06/04/19 10:26 PM  Result Value Ref Range   Color, Urine COLORLESS (A) YELLOW   APPearance CLEAR CLEAR   Specific Gravity, Urine 1.005 1.005 - 1.030   pH 7.0 5.0 - 8.0   Glucose, UA NEGATIVE NEGATIVE mg/dL   Hgb urine dipstick NEGATIVE NEGATIVE   Bilirubin Urine NEGATIVE NEGATIVE   Ketones, ur NEGATIVE NEGATIVE mg/dL   Protein, ur NEGATIVE NEGATIVE mg/dL   Nitrite NEGATIVE NEGATIVE   Leukocytes,Ua TRACE (A) NEGATIVE   RBC / HPF 0-5 0 - 5 RBC/hpf   WBC, UA 0-5 0 - 5 WBC/hpf   Bacteria, UA RARE (A) NONE SEEN   Squamous Epithelial / LPF 0-5 0 - 5   CBC     Status: Abnormal   Collection Time: 06/04/19 11:24 PM  Result Value Ref Range   WBC 8.1 4.0 - 10.5 K/uL   RBC 3.98 3.87 - 5.11 MIL/uL   Hemoglobin 11.6 (L) 12.0 - 15.0 g/dL   HCT 35.2 (L) 36.0 - 46.0 %   MCV 88.4 80.0 - 100.0 fL   MCH 29.1 26.0 - 34.0 pg   MCHC 33.0 30.0 - 36.0 g/dL   RDW 11.9 11.5 - 15.5 %   Platelets 262 150 - 400 K/uL   nRBC 0.0 0.0 - 0.2 %  hCG, quantitative, pregnancy     Status: Abnormal   Collection Time: 06/04/19 11:24 PM  Result Value Ref Range   hCG, Beta Chain, Quant, S 16,880 (H) <5 mIU/mL  Pregnancy,  urine POC     Status: Abnormal   Collection Time: 06/04/19 11:30 PM  Result Value Ref Range   Preg Test, Ur POSITIVE (A) NEGATIVE  Wet prep, genital     Status: Abnormal   Collection Time: 06/04/19 11:43 PM   Specimen: PATH Cytology Cervicovaginal Ancillary Only  Result Value Ref Range   Yeast Wet Prep HPF POC NONE SEEN NONE SEEN   Trich, Wet Prep NONE SEEN NONE SEEN   Clue Cells Wet Prep HPF POC NONE SEEN NONE SEEN   WBC, Wet Prep HPF POC MANY (A) NONE SEEN   Sperm NONE SEEN   Respiratory Panel by RT PCR (Flu A&B, Covid) - Nasopharyngeal Swab     Status: None   Collection Time: 06/05/19 12:33 AM   Specimen: Nasopharyngeal Swab  Result Value Ref Range   SARS Coronavirus 2 by RT PCR NEGATIVE NEGATIVE   Influenza A by PCR NEGATIVE NEGATIVE   Influenza B by PCR NEGATIVE NEGATIVE  Comprehensive metabolic panel     Status: None   Collection Time: 06/05/19 12:36 AM  Result Value Ref Range   Sodium 139 135 - 145 mmol/L   Potassium 3.9 3.5 - 5.1 mmol/L   Chloride 105 98 - 111 mmol/L   CO2 25 22 - 32 mmol/L   Glucose, Bld 99 70 - 99 mg/dL   BUN 10 6 - 20 mg/dL   Creatinine, Ser 5.32 0.44 - 1.00 mg/dL   Calcium 9.0 8.9 - 99.2 mg/dL   Total Protein 7.2 6.5 - 8.1 g/dL   Albumin 4.1 3.5 - 5.0 g/dL   AST 15 15 - 41 U/L   ALT 15 0 - 44 U/L   Alkaline Phosphatase 44 38 - 126 U/L   Total Bilirubin 0.3 0.3 - 1.2 mg/dL   GFR calc non Af Amer >60  >60 mL/min   GFR calc Af Amer >60 >60 mL/min   Anion gap 9 5 - 15  Type and screen [facility]     Status: None   Collection Time: 06/05/19 12:38 AM  Result Value Ref Range   ABO/RH(D) AB NEG    Antibody Screen NEG    Sample Expiration      06/08/2019,2359 Performed at [facility], [address], [place], [place] [address]   ABO/Rh     Status: None (Preliminary result)   Collection Time: 06/05/19 12:38 AM  Result Value Ref Range   ABO/RH(D)      AB NEG Performed at [facility], [address] [place], [place] [address]   CBC with Differential/Platelet     Status: Abnormal   Collection Time: 06/05/19  1:15 AM  Result Value Ref Range   WBC 8.1 4.0 - 10.5 K/uL   RBC 3.95 3.87 - 5.11 MIL/uL   Hemoglobin 11.6 (L) 12.0 - 15.0 g/dL   HCT 22.9 (L) 79.8 - 92.1 %   MCV 88.9 80.0 - 100.0 fL   MCH 29.4 26.0 - 34.0 pg   MCHC 33.0 30.0 - 36.0 g/dL   RDW 19.4 17.4 - 08.1 %   Platelets 272 150 - 400 K/uL   nRBC 0.0 0.0 - 0.2 %   Neutrophils Relative % 62 %   Neutro Abs 5.0 1.7 - 7.7 K/uL   Lymphocytes Relative 29 %   Lymphs Abs 2.4 0.7 - 4.0 K/uL   Monocytes Relative 6 %   Monocytes Absolute 0.5 0.1 - 1.0 K/uL   Eosinophils Relative 2 %  Eosinophils Absolute 0.2 0.0 - 0.5 K/uL   Basophils Relative 1 %   Basophils Absolute 0.1 0.0 - 0.1 K/uL   Immature Granulocytes 0 %   Abs Immature Granulocytes 0.02 0.00 - 0.07 K/uL    US OB LESS THAN 14 WEEKS WITH OB TRANSVAGINAL  Addendum Date: 06/05/2019   ADDENDUM REPORT: 06/05/2019 00:43 ADDENDUM: Critical Value/emergent results were called by telephone at the time of interpretation on 06/05/2019 at 12:43 am to providerVERONICA ROGERS , who verbally acknowledged these results. Electronically Signed   By: Kreg Shropshire M.D.   On: 06/05/2019 00:43   Result Date: 06/05/2019 CLINICAL DATA:  Abdominal pain for 1 week increasing acutely, no vaginal bleeding EXAM: OBSTETRIC <14 WK [place] AND TRANSVAGINAL OB US  TECHNIQUE: Both transabdominal and transvaginal ultrasound examinations were performed for complete evaluation of the gestation as well as the maternal uterus, adnexal regions, and pelvic cul-de-sac. Transvaginal technique was performed to assess early pregnancy. COMPARISON:  Prior gestational ultrasound 03/03/2018 FINDINGS: Intrauterine gestational sac: None, a gestational sac is visualized in the left adnexa Yolk sac:  Visualized. Embryo:  Visualized. Cardiac Activity: Visualized. Heart Rate: 110 bpm CRL:  5.71 mm   6 w   2 d                  [place] EDC: 01/26/2020 Maternal uterus/adnexae: There is an ectopic pregnancy in the left adnexa which appears inferomedial to the left ovary. Left ovary itself is normal. Right ovary is also unremarkable. Trace amount of anechoic free fluid is seen in the posterior cul-de-sac. IMPRESSION: Left adnexal ectopic pregnancy at approximately 6 weeks 2 days of gestation with demonstrable heart beat and bradycardia at this time. Trace anechoic fluid in the pelvis. Currently attempting to contact the ordering provider. Addendum to be submitted upon case discussion. Electronically Signed: By: Kreg Shropshire M.D. On: 06/05/2019 00:33    Assessment/Plan: 38yo G3P2002 at 6+ with ectopic pregnancy  Operative laparoscopy, L salpingectomy D/w pt r/b/a Will proceed when OR available  Albino Bufford Bovard-Stuckert 06/05/2019, 2:38 AM

## 2019-06-05 NOTE — Anesthesia Procedure Notes (Signed)
Procedure Name: Intubation Date/Time: 06/05/2019 4:07 AM Performed by: Jeziel Hoffmann T, CRNA Pre-anesthesia Checklist: Patient identified, Emergency Drugs available, Suction available and Patient being monitored Patient Re-evaluated:Patient Re-evaluated prior to induction Oxygen Delivery Method: Circle system utilized Preoxygenation: Pre-oxygenation with 100% oxygen Induction Type: IV induction and Rapid sequence Ventilation: Mask ventilation without difficulty Laryngoscope Size: Miller and 3 Grade View: Grade I Tube type: Oral Tube size: 7.0 mm Number of attempts: 1 Airway Equipment and Method: Stylet and Oral airway Placement Confirmation: ETT inserted through vocal cords under direct vision,  positive ETCO2 and breath sounds checked- equal and bilateral Secured at: 22 cm Tube secured with: Tape Dental Injury: Teeth and Oropharynx as per pre-operative assessment

## 2019-06-05 NOTE — MAU Provider Note (Signed)
History     CSN: [id number]  Arrival date and time: 06/04/19 2216   First Provider Initiated Contact with Patient 06/04/19 2334     Chief Complaint  Patient presents with  . Abdominal Pain   Amber Taylor is 38 y.o. G3P2 at [redacted]w[redacted]d by LMP who presents to MAU with complaints of abdominal pain. She reports abdominal pain started occurring last week. Describes pain as lower abdominal cramping, rates pain 8/10- has not taken any medication for pain. Reports pain initially went away then reports yesterday the pain return. She reports pain got worse and only helped pain was heating pad. Reports being curled in the fetal position today d/t pain. Denies vaginal bleeding, vaginal discharge, urinary symptoms.   OB History    Gravida  3   Para  2   Term  2   Preterm      AB      Living  2     SAB      TAB      Ectopic      Multiple  0   Live Births  2           Past Medical History:  Diagnosis Date  . Allergy   . AMA (advanced maternal age) primigravida 35+   . Complication of anesthesia    neurologist concerned about general anesthesia if needed  . Eczema   . Headache   . Placenta previa antepartum in third trimester 01/28/2018   PLACENTA PREVIA:  General counseling was then performed regarding placenta previa.  Ninety percent of placenta previa diagnosed in the early second trimester will resolve by term.  There is an association with IUGR therefore serial U/S every 4 weeks for placental location and fetal growth is indicated.  Patients have a 4-8% risk of recurrence in subsequent pregnancies.  . Polyhydramnios affecting pregnancy in third trimester 04/20/2016  . Seizures (HCC)   . Status post primary low transverse cesarean section 03/04/2018    Past Surgical History:  Procedure Laterality Date  . CESAREAN SECTION N/A 03/04/2018   Procedure: CESAREAN SECTION;  Surgeon: Edwinna Areola, DO;  Location: [facility];  Service: Obstetrics;  Laterality: N/A;   Heather, RNFA Seizure disorder, MFM recommendation of delivery at 37wks, previa  Spoke with Misty Stanley to add to this day  . CHOLECYSTECTOMY    . WISDOM TOOTH EXTRACTION      Family History  Problem Relation Age of Onset  . Cancer Mother        breast  . Cancer Maternal Aunt   . Cancer Maternal Grandfather   . Heart disease Paternal Grandmother     Social History   Tobacco Use  . Smoking status: Never Smoker  . Smokeless tobacco: Never Used  Substance Use Topics  . Alcohol use: Not Currently    Alcohol/week: 0.0 standard drinks  . Drug use: No    Allergies:  Allergies  Allergen Reactions  . Peanut-Containing Drug Products     Feels ALL NUTS contraindicates medicine triggering seizures.  . Augmentin [Amoxicillin-Pot Clavulanate] Rash    Has patient had a PCN reaction causing immediate rash, facial/tongue/throat swelling, SOB or lightheadedness with hypotension: No Has patient had a PCN reaction causing severe rash involving mucus membranes or skin necrosis: No Has patient had a PCN reaction that required hospitalization: No Has patient had a PCN reaction occurring within the last 10 years: Yes--RASH ON LEGS ONLY If all of the above answers are "NO", then may proceed with Cephalosporin  use.     Medications Prior to Admission  Medication Sig Dispense Refill Last Dose  . ferrous sulfate 325 (65 FE) MG EC tablet Take 325 mg by mouth at bedtime.     Marland Kitchen. ibuprofen (ADVIL,MOTRIN) 600 MG tablet Take 1 tablet (600 mg total) by mouth every 6 (six) hours. 30 tablet 0   . LAMICTAL 100 MG tablet Take 100 mg by mouth every morning.   6   . LAMICTAL XR 200 MG TB24 Take 600 mg by mouth at bedtime.   6   . Levomefolate Glucosamine (METHYLFOLATE PO) Take 800 mcg by mouth at bedtime.     Marland Kitchen. LORazepam (ATIVAN) 0.5 MG tablet Take 1 tablet (0.5 mg total) by mouth every 8 (eight) hours as needed for seizure (seizure activity). 15 tablet 0   . Prenatal Vit-Fe Fumarate-FA (PRENATAL MULTIVITAMIN) TABS  tablet Take 1 tablet by mouth daily at 12 noon. (Patient taking differently: Take 1 tablet by mouth at bedtime. ) 100 tablet 3   . VIMPAT 200 MG TABS tablet Take 200 mg by mouth 2 (two) times daily.  5     Review of Systems  Constitutional: Negative.   Respiratory: Negative.   Cardiovascular: Negative.   Gastrointestinal: Positive for abdominal pain. Negative for constipation, diarrhea, nausea and vomiting.  Genitourinary: Negative.   Neurological: Negative.   Psychiatric/Behavioral: Negative.    Physical Exam   Blood pressure 107/64, pulse 78, temperature 97.7 F (36.5 C), resp. rate 16, height 5\' 8"  (1.727 m), weight 60.3 kg, last menstrual period 04/19/2019, unknown if currently breastfeeding.  Physical Exam  Constitutional: She is oriented to person, place, and time. She appears well-developed and well-nourished.  Cardiovascular: Normal rate and regular rhythm.  Respiratory: Effort normal and breath sounds normal. No respiratory distress. She has no wheezes.  GI: Soft. She exhibits no distension. There is abdominal tenderness. There is rebound and guarding.  Abdominal tenderness and guarding with palpation in LLQ and RLQ - patient reports pain is worse with palpation in LLQ  Genitourinary:    Genitourinary Comments: Bimanual exam: Cervix 0/long/high, firm, anterior, neg CMT, uterus nontender, nonenlarged, adnexa with  Tenderness bilaterally, no mass palpated   Musculoskeletal:        General: No edema. Normal range of motion.  Neurological: She is alert and oriented to person, place, and time.  Psychiatric: She has a normal mood and affect. Her behavior is normal. Thought content normal.   MAU Course  Procedures  MDM Orders Placed This Encounter  Procedures  . Wet prep, genital  . Respiratory Panel by RT PCR (Flu A&B, Covid) - Nasopharyngeal Swab  . US OB LESS THAN 14 WEEKS WITH OB TRANSVAGINAL  . Urinalysis, Routine w reflex microscopic  . CBC  . hCG, quantitative,  pregnancy  . Differential  . Comprehensive metabolic panel  . Pregnancy, urine POC  . Type and screen [facility]   Labs and US report reviewed:  Results for orders placed or performed during the hospital encounter of 06/04/19 (from the past 24 hour(s))  Urinalysis, Routine w reflex microscopic     Status: Abnormal   Collection Time: 06/04/19 10:26 PM  Result Value Ref Range   Color, Urine COLORLESS (A) YELLOW   APPearance CLEAR CLEAR   Specific Gravity, Urine 1.005 1.005 - 1.030   pH 7.0 5.0 - 8.0   Glucose, UA NEGATIVE NEGATIVE mg/dL   Hgb urine dipstick NEGATIVE NEGATIVE   Bilirubin Urine NEGATIVE NEGATIVE   Ketones, ur  NEGATIVE NEGATIVE mg/dL   Protein, ur NEGATIVE NEGATIVE mg/dL   Nitrite NEGATIVE NEGATIVE   Leukocytes,Ua TRACE (A) NEGATIVE   RBC / HPF 0-5 0 - 5 RBC/hpf   WBC, UA 0-5 0 - 5 WBC/hpf   Bacteria, UA RARE (A) NONE SEEN   Squamous Epithelial / LPF 0-5 0 - 5  CBC     Status: Abnormal   Collection Time: 06/04/19 11:24 PM  Result Value Ref Range   WBC 8.1 4.0 - 10.5 K/uL   RBC 3.98 3.87 - 5.11 MIL/uL   Hemoglobin 11.6 (L) 12.0 - 15.0 g/dL   HCT 35.2 (L) 36.0 - 46.0 %   MCV 88.4 80.0 - 100.0 fL   MCH 29.1 26.0 - 34.0 pg   MCHC 33.0 30.0 - 36.0 g/dL   RDW 11.9 11.5 - 15.5 %   Platelets 262 150 - 400 K/uL   nRBC 0.0 0.0 - 0.2 %  hCG, quantitative, pregnancy     Status: Abnormal   Collection Time: 06/04/19 11:24 PM  Result Value Ref Range   hCG, Beta Chain, Quant, S 16,880 (H) <5 mIU/mL  Pregnancy, urine POC     Status: Abnormal   Collection Time: 06/04/19 11:30 PM  Result Value Ref Range   Preg Test, Ur POSITIVE (A) NEGATIVE  Wet prep, genital     Status: Abnormal   Collection Time: 06/04/19 11:43 PM   Specimen: PATH Cytology Cervicovaginal Ancillary Only  Result Value Ref Range   Yeast Wet Prep HPF POC NONE SEEN NONE SEEN   Trich, Wet Prep NONE SEEN NONE SEEN   Clue Cells Wet Prep HPF POC NONE SEEN NONE SEEN   WBC, Wet Prep HPF POC  MANY (A) NONE SEEN   Sperm NONE SEEN   Respiratory Panel by RT PCR (Flu A&B, Covid) - Nasopharyngeal Swab     Status: None   Collection Time: 06/05/19 12:33 AM   Specimen: Nasopharyngeal Swab  Result Value Ref Range   SARS Coronavirus 2 by RT PCR NEGATIVE NEGATIVE   Influenza A by PCR NEGATIVE NEGATIVE   Influenza B by PCR NEGATIVE NEGATIVE  Comprehensive metabolic panel     Status: None   Collection Time: 06/05/19 12:36 AM  Result Value Ref Range   Sodium 139 135 - 145 mmol/L   Potassium 3.9 3.5 - 5.1 mmol/L   Chloride 105 98 - 111 mmol/L   CO2 25 22 - 32 mmol/L   Glucose, Bld 99 70 - 99 mg/dL   BUN 10 6 - 20 mg/dL   Creatinine, Ser 0.69 0.44 - 1.00 mg/dL   Calcium 9.0 8.9 - 10.3 mg/dL   Total Protein 7.2 6.5 - 8.1 g/dL   Albumin 4.1 3.5 - 5.0 g/dL   AST 15 15 - 41 U/L   ALT 15 0 - 44 U/L   Alkaline Phosphatase 44 38 - 126 U/L   Total Bilirubin 0.3 0.3 - 1.2 mg/dL   GFR calc non Af Amer >60 >60 mL/min   GFR calc Af Amer >60 >60 mL/min   Anion gap 9 5 - 15  Type and screen [facility]     Status: None   Collection Time: 06/05/19 12:38 AM  Result Value Ref Range   ABO/RH(D) AB NEG    Antibody Screen NEG    Sample Expiration      06/08/2019,2359 Performed at [facility], [address] [place], [place] [address]   ABO/Rh     Status: None (  Preliminary result)   Collection Time: 06/05/19 12:38 AM  Result Value Ref Range   ABO/RH(D)      AB NEG Performed at [facility], [address] [place], [place] [address]   CBC with Differential/Platelet     Status: Abnormal   Collection Time: 06/05/19  1:15 AM  Result Value Ref Range   WBC 8.1 4.0 - 10.5 K/uL   RBC 3.95 3.87 - 5.11 MIL/uL   Hemoglobin 11.6 (L) 12.0 - 15.0 g/dL   HCT 60.4 (L) 54.0 - 98.1 %   MCV 88.9 80.0 - 100.0 fL   MCH 29.4 26.0 - 34.0 pg   MCHC 33.0 30.0 - 36.0 g/dL   RDW 19.1 47.8 - 29.5 %   Platelets 272 150 - 400 K/uL   nRBC 0.0 0.0 - 0.2 %   Neutrophils Relative % 62  %   Neutro Abs 5.0 1.7 - 7.7 K/uL   Lymphocytes Relative 29 %   Lymphs Abs 2.4 0.7 - 4.0 K/uL   Monocytes Relative 6 %   Monocytes Absolute 0.5 0.1 - 1.0 K/uL   Eosinophils Relative 2 %   Eosinophils Absolute 0.2 0.0 - 0.5 K/uL   Basophils Relative 1 %   Basophils Absolute 0.1 0.0 - 0.1 K/uL   Immature Granulocytes 0 %   Abs Immature Granulocytes 0.02 0.00 - 0.07 K/uL   US OB LESS THAN 14 WEEKS WITH OB TRANSVAGINAL  Result Date: 06/05/2019 CLINICAL DATA:  Abdominal pain for 1 week increasing acutely, no vaginal bleeding EXAM: OBSTETRIC <14 WK [place] AND TRANSVAGINAL OB US TECHNIQUE: Both transabdominal and transvaginal ultrasound examinations were performed for complete evaluation of the gestation as well as the maternal uterus, adnexal regions, and pelvic cul-de-sac. Transvaginal technique was performed to assess early pregnancy. COMPARISON:  Prior gestational ultrasound 03/03/2018 FINDINGS: Intrauterine gestational sac: None, a gestational sac is visualized in the left adnexa Yolk sac:  Visualized. Embryo:  Visualized. Cardiac Activity: Visualized. Heart Rate: 110 bpm CRL:  5.71 mm   6 w   2 d                  [place] EDC: 01/26/2020 Maternal uterus/adnexae: There is an ectopic pregnancy in the left adnexa which appears inferomedial to the left ovary. Left ovary itself is normal. Right ovary is also unremarkable. Trace amount of anechoic free fluid is seen in the posterior cul-de-sac. IMPRESSION: Left adnexal ectopic pregnancy at approximately 6 weeks 2 days of gestation with demonstrable heart beat and bradycardia at this time. Trace anechoic fluid in the pelvis. Currently attempting to contact the ordering provider. Addendum to be submitted upon case discussion. Electronically Signed   By: Kreg Shropshire M.D.   On: 06/05/2019 00:33   Discussed results of [place] with patient- discussed with patient that final report from radiologist was pending at this time.   Dr Ellyn Hack notified of ultrasound results and  plans to come in to discuss surgery with patient]  Type and Screen, COVID screening and IV ordered  Results of labs reviewed with Dr Ellyn Hack and care taken over by Dr Ellyn Hack   Assessment and Plan   1. Left tubal pregnancy without intrauterine pregnancy   2. Abdominal pain during pregnancy in first trimester    Prepare for OR  Care taken over by Dr Ellyn Hack   Sharyon Cable CNM 06/05/2019, 3:28 AM

## 2019-06-05 NOTE — MAU Note (Signed)
Pt has diamond ring and band on L ring finger which she is unable to get off.

## 2019-06-05 NOTE — MAU Note (Signed)
Covid swab obtained without difficulty and pt tol well. No symptoms 

## 2019-06-08 LAB — GC/CHLAMYDIA PROBE AMP (~~LOC~~) NOT AT ARMC
Chlamydia: NEGATIVE
Comment: NEGATIVE
Comment: NORMAL
Neisseria Gonorrhea: NEGATIVE

## 2019-06-08 LAB — SURGICAL PATHOLOGY

## 2019-06-26 ENCOUNTER — Ambulatory Visit: Payer: Self-pay | Admitting: Psychiatry

## 2019-07-09 ENCOUNTER — Encounter: Payer: Self-pay | Admitting: Psychiatry

## 2019-07-09 ENCOUNTER — Other Ambulatory Visit: Payer: Self-pay

## 2019-07-09 ENCOUNTER — Ambulatory Visit (INDEPENDENT_AMBULATORY_CARE_PROVIDER_SITE_OTHER): Payer: Self-pay | Admitting: Psychiatry

## 2019-07-09 DIAGNOSIS — F4310 Post-traumatic stress disorder, unspecified: Secondary | ICD-10-CM
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
## 2019-07-23 ENCOUNTER — Other Ambulatory Visit: Payer: Self-pay

## 2019-07-23 ENCOUNTER — Ambulatory Visit (INDEPENDENT_AMBULATORY_CARE_PROVIDER_SITE_OTHER): Payer: Self-pay | Admitting: Psychiatry

## 2019-07-23 DIAGNOSIS — F4310 Post-traumatic stress disorder, unspecified: Secondary | ICD-10-CM
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
## 2019-08-06 ENCOUNTER — Other Ambulatory Visit: Payer: Self-pay

## 2019-08-06 ENCOUNTER — Ambulatory Visit (INDEPENDENT_AMBULATORY_CARE_PROVIDER_SITE_OTHER): Payer: Self-pay | Admitting: Psychiatry

## 2019-08-06 DIAGNOSIS — F4310 Post-traumatic stress disorder, unspecified: Secondary | ICD-10-CM
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
## 2019-08-20 ENCOUNTER — Ambulatory Visit (INDEPENDENT_AMBULATORY_CARE_PROVIDER_SITE_OTHER): Payer: Self-pay | Admitting: Psychiatry

## 2019-08-20 ENCOUNTER — Other Ambulatory Visit: Payer: Self-pay

## 2019-08-20 DIAGNOSIS — F4310 Post-traumatic stress disorder, unspecified: Secondary | ICD-10-CM
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
## 2019-09-03 ENCOUNTER — Other Ambulatory Visit: Payer: Self-pay

## 2019-09-03 ENCOUNTER — Ambulatory Visit (INDEPENDENT_AMBULATORY_CARE_PROVIDER_SITE_OTHER): Payer: Self-pay | Admitting: Psychiatry

## 2019-09-03 DIAGNOSIS — F4310 Post-traumatic stress disorder, unspecified: Secondary | ICD-10-CM
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
## 2019-09-17 ENCOUNTER — Other Ambulatory Visit: Payer: Self-pay

## 2019-09-17 ENCOUNTER — Ambulatory Visit (INDEPENDENT_AMBULATORY_CARE_PROVIDER_SITE_OTHER): Payer: Self-pay | Admitting: Psychiatry

## 2019-09-17 DIAGNOSIS — F4310 Post-traumatic stress disorder, unspecified: Secondary | ICD-10-CM
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
## 2019-10-01 ENCOUNTER — Ambulatory Visit (INDEPENDENT_AMBULATORY_CARE_PROVIDER_SITE_OTHER): Payer: Self-pay | Admitting: Psychiatry

## 2019-10-01 ENCOUNTER — Other Ambulatory Visit: Payer: Self-pay

## 2019-10-01 ENCOUNTER — Encounter: Payer: Self-pay | Admitting: Psychiatry

## 2019-10-01 DIAGNOSIS — F4310 Post-traumatic stress disorder, unspecified: Secondary | ICD-10-CM

## 2019-10-01 NOTE — Progress Notes (Signed)
      [facility] Counselor/Therapist Progress Note  Patient ID: Amber Taylor, MRN: [id number],    Date: 10/01/2019  Time Spent: 52 minutes start time 9:06 AM end time 9:58 AM  Treatment Type: Individual Therapy  Reported Symptoms: seizure,anxiety, sadness, nightmares, triggered responses   Mental Status Exam:  Appearance:   Well Groomed     Behavior:  Appropriate  Motor:  Normal  Speech/Language:   Normal Rate  Affect:  Appropriate  Mood:  anxious  Thought process:  normal  Thought content:    WNL  Sensory/Perceptual disturbances:    WNL  Orientation:  oriented to person, place, time/date and situation  Attention:  Good  Concentration:  Good  Memory:  WNL  Fund of knowledge:   Good  Insight:    Good  Judgment:   Good  Impulse Control:  Good   Risk Assessment: Danger to Self:  No Self-injurious Behavior: No Danger to Others: No Duty to Warn:no Physical Aggression / Violence:No  Access to Firearms a concern: No  Gang Involvement:No   Subjective: Patient was present for session.  She shared that she is having seizures again and she is concerned that about that.  She also shared that her husband is thinking she can prevent the seizures, there are some things she can do to take care of herself but she does have epilepsy.  Patient reported that she is struggling with the fact that her mother just didn't care.  Patient went on to explain that some of the past issues with her mother and communication are impacting her relationship with her husband and their communication.  Did EMDR set on husband being upset when she communicates poorly, suds level 10, negative cognition "I am failing" felt anxiety in her chest.  Patient was able to reduce suds level to 5.  What came out through the processing is there are a lot of different areas where she is trying to get everything right and be perfect but it is not realistic within her situation.  Patient was encouraged to set daily goals rather  than wanting to accomplish everything in 1 day.  She is to remind herself she does not need to be perfect and all she can do is the best she can do to get through each day.  Interventions: Solution-Oriented/Positive Psychology and Eye Movement Desensitization and Reprocessing (EMDR)  Diagnosis:   ICD-10-CM   1. PTSD (post-traumatic stress disorder)  F43.10     Plan: Patient is to use CBT and coping skills to decrease triggered emotions.  Patient is to work on setting daily goals that are attainable.  Patient is to work on her self talk reminding herself she can only do the best she can do each day. Long-term goal: Develop and implement effective coping skills to carry out normal responsibilities and participate constructively in relationships Short-term goal: Practice implement relaxation training as a coping mechanism for tension panic stress anger and anxiety-decrease intrusive thoughts by 50%  Stevphen Meuse, [facility]

## 2019-10-16 ENCOUNTER — Other Ambulatory Visit: Payer: Self-pay

## 2019-10-16 ENCOUNTER — Ambulatory Visit (INDEPENDENT_AMBULATORY_CARE_PROVIDER_SITE_OTHER): Payer: Self-pay | Admitting: Psychiatry

## 2019-10-16 DIAGNOSIS — F4310 Post-traumatic stress disorder, unspecified: Secondary | ICD-10-CM

## 2019-10-16 NOTE — Progress Notes (Signed)
[facility] Counselor/Therapist Progress Note  Patient ID: Hamsini Verrilli, MRN: [id number],    Date: 10/16/2019  Time Spent: 52 minutes start time 12:04 PM end time 12:56 PM  Treatment Type: Individual Therapy  Reported Symptoms: anxiety, triggered response, focusing issues, health issues  Mental Status Exam:  Appearance:   Well Groomed     Behavior:  Appropriate  Motor:  Normal  Speech/Language:   Normal Rate  Affect:  Appropriate  Mood:  normal  Thought process:  normal  Thought content:    WNL  Sensory/Perceptual disturbances:    WNL  Orientation:  oriented to person, place, time/date and situation  Attention:  Good  Concentration:  Good  Memory:  WNL  Fund of knowledge:   Good  Insight:    Good  Judgment:   Good  Impulse Control:  Good   Risk Assessment: Danger to Self:  No Self-injurious Behavior: No Danger to Others: No Duty to Warn:no Physical Aggression / Violence:No  Access to Firearms a concern: No  Gang Involvement:No   Subjective: Patient was present for session.  She reported she is pregnant and is excited about it.  She is noticing a pattern in the mornings and when she wakes up in the morning due to an alarm that makes it hard and if she doesn't journal she is more likely to feel seizures coming on. Patient went on to share her husband is trying to get her to increase her medication and he feels that that will take care of everything.  Patient was able to recognize the fact he does not seem to understand her seizures are not within her control and have only one trigger.  Patient shared that she is trying to figure out how to communicate that with him.  Patient also shared that she is realizing she needs more space and more time outside to help her mood.  Patient explained she is realizing it is time for them to get a home and she and her husband discussed it but they are not sure how to manage everything currently.  Patient shared that her father has offered  to give them some money for down payment but there are no details.  Discussed the fact that she is probably going to have to be assertive with her father because her mother was very assertive with him and he is not very good at handling things that are not direct.  Patient acknowledged the fact that she is typically not direct either but that she could do it in the situation.  She shared she is going to the beach with him her father-in-law and her brother-in-law next week and that may be the perfect time for them to have a discussion.  Discussed different things that she would need to talk to her husband and father about for the situation to fall into place.  Patient was also encouraged to focus on trying to take some time so that she can decrease her anxiety symptoms and not be triggered.  Patient agreed to continue journaling doing her art and trying to find time just to do her meditation deep breathing to help keep her self as calm as possible.  Interventions: Cognitive Behavioral Therapy and Solution-Oriented/Positive Psychology  Diagnosis:   ICD-10-CM   1. PTSD (post-traumatic stress disorder)  F43.10     Plan: Patient is to use CBT and coping skills to decrease triggered responses.  Patient is to follow plans from session to communicate her needs with  her father and her husband appropriately.  Patient is also to work on [employer] and meditation to try and work on keeping her anxiety levels low. Long-term goal: Develop and implement effective coping skills to carry out normal responsibilities and participate constructively in relationships Short-term goal: Practice implement relaxation training as a coping mechanism for tension panic stress anger and anxiety-decrease intrusive thoughts by 50%  Stevphen Meuse, [facility]

## 2019-10-29 ENCOUNTER — Ambulatory Visit (INDEPENDENT_AMBULATORY_CARE_PROVIDER_SITE_OTHER): Payer: Self-pay | Admitting: Psychiatry

## 2019-10-29 DIAGNOSIS — F4310 Post-traumatic stress disorder, unspecified: Secondary | ICD-10-CM
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
## 2019-11-12 ENCOUNTER — Ambulatory Visit (INDEPENDENT_AMBULATORY_CARE_PROVIDER_SITE_OTHER): Payer: Self-pay | Admitting: Psychiatry

## 2019-11-12 DIAGNOSIS — F4310 Post-traumatic stress disorder, unspecified: Secondary | ICD-10-CM
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
## 2019-11-26 ENCOUNTER — Ambulatory Visit: Payer: Self-pay | Admitting: Psychiatry

## 2020-01-07 ENCOUNTER — Ambulatory Visit (INDEPENDENT_AMBULATORY_CARE_PROVIDER_SITE_OTHER): Payer: Self-pay | Admitting: Psychiatry

## 2020-01-07 ENCOUNTER — Other Ambulatory Visit: Payer: Self-pay

## 2020-01-07 DIAGNOSIS — F4310 Post-traumatic stress disorder, unspecified: Secondary | ICD-10-CM

## 2020-01-07 NOTE — Progress Notes (Signed)
      [facility] Counselor/Therapist Progress Note  Patient ID: Amber Taylor, MRN: [id number],    Date: 01/07/2020  Time Spent: 52 minutes start time 10:07 AM end time 10:59 AM  Treatment Type: Individual Therapy  Reported Symptoms: anxiety, triggered responses, panic attacks, racing thoughts-patient feels it is tied to her medications for her seizures  Mental Status Exam:  Appearance:   Well Groomed     Behavior:  Appropriate  Motor:  Normal  Speech/Language:   Normal Rate  Affect:  Appropriate  Mood:  normal  Thought process:  normal  Thought content:    WNL  Sensory/Perceptual disturbances:    WNL  Orientation:  oriented to person, place, time/date and situation  Attention:  Good  Concentration:  Good  Memory:  WNL  Fund of knowledge:   Good  Insight:    Good  Judgment:   Good  Impulse Control:  Good   Risk Assessment: Danger to Self:  No Self-injurious Behavior: No Danger to Others: No Duty to Warn:no Physical Aggression / Violence:No  Access to Firearms a concern: No  Gang Involvement:No   Subjective: Patient was present for session.  She shared that she is having lots of health stress due to the pregnancy and seizures.  She got a new nanny which has been a a transition.  She lost her job and is in the process of looking for a new one.  She is hopeful that something will come along soon.  Patient shared that she has been following through on plans from previous sessions to help her sleep and try and wake up at a better place and she is finding it helpful.  She is still having to make adjustments to her seizure medications to try and keep herself from having seizures but at this point things are still okay.  She is working on some different parenting techniques with her son and finding them helpful.  She shared that some things have been triggered from her husband's behavior but she has been able to use her CBT skills and talk herself through them appropriately.  Patient  did have a few things to process in session concerning her husband and discussed different ways to try and keep herself calm and allow him to do the work that he needs to do.  Patient was encouraged to feel positive about all that she has been doing especially as she has been pregnant and managing her potential seizures.  Patient is to continue journaling and what seems to surface through that process will be addressed at next session.  Interventions: Solution-Oriented/Positive Psychology  Diagnosis:   ICD-10-CM   1. PTSD (post-traumatic stress disorder)  F43.10     Plan: Patient is to continue using CBT and coping skills to decrease triggered responses.  Patient is to continue working on taking medication as directed to help decrease seizures.  Patient is to continue journaling to release negative emotions appropriately.  Patient is to continue talking herself through different interactions with her son and husband that may trigger her emotions. Long term goal: Develop and implement coping skills to carry out normal responsibilities  And participate constructively in relationships Short term goal: Practice, implement relaxation training as a coping mechanism for tension, panic, stress, anger, and anxiety decrease intrustive thoughts by 50%  Stevphen Meuse, [facility]

## 2020-01-21 ENCOUNTER — Ambulatory Visit (INDEPENDENT_AMBULATORY_CARE_PROVIDER_SITE_OTHER): Payer: Self-pay | Admitting: Psychiatry

## 2020-01-21 ENCOUNTER — Other Ambulatory Visit: Payer: Self-pay

## 2020-01-21 DIAGNOSIS — F4310 Post-traumatic stress disorder, unspecified: Secondary | ICD-10-CM

## 2020-01-21 NOTE — Progress Notes (Signed)
      [facility] Counselor/Therapist Progress Note  Patient ID: Amber Taylor, MRN: [id number],    Date: 01/21/2020  Time Spent: 50 minutes start time 10:01 AM end time 10:51 AM  Treatment Type: Individual Therapy  Reported Symptoms: anxiety, health issues  Mental Status Exam:  Appearance:   Well Groomed     Behavior:  Appropriate  Motor:  Normal  Speech/Language:   Normal Rate  Affect:  Appropriate  Mood:  normal  Thought process:  normal  Thought content:    WNL  Sensory/Perceptual disturbances:    WNL  Orientation:  oriented to person, place, time/date and situation  Attention:  Good  Concentration:  Good  Memory:  WNL  Fund of knowledge:   Good  Insight:    Good  Judgment:   Good  Impulse Control:  Good   Risk Assessment: Danger to Self:  No Self-injurious Behavior: No Danger to Others: No Duty to Warn:no Physical Aggression / Violence:No  Access to Firearms a concern: No  Gang Involvement:No   Subjective: Patient was present for session.  She shared that she is having lots of stress with work. She is also  Having some health issues. She has had periods that she has been content with her situation. She is recognizing some things that her husband says can be triggering for her.  She is realizing that she has to remind herself that he is not her mother and doesn't have the same meaning when he says things.  Patient reported she would like to do an EMDR set but since she is not sure about her seizures and being pregnant it was agreed this was not the appropriate time.  Patient was allowed time just to share some of the other concerns in the things that have been triggering her lately.  She is recognizing that her father cannot be there for her the way that she needs him to be.  She was also able to recognize that she cannot give power over to her father-in-law.  Discussed healthy ways to set limits with him especially when his negativity seems to be overwhelming for her.   Patient was also encouraged to remind herself regularly that everybody will have an opinion about how she does things with her husband and her children but that does not mean that they are correct.  Patient was especially encouraged to use CBT filter when people that she does not feel comfortable with her parenting styles make comments about hers.  She was reminded that if they are criticizing her than it is probably a positive thing.  Patient was encouraged to use her CBT filters to keep her self grounded.  Interventions: Cognitive Behavioral Therapy and Solution-Oriented/Positive Psychology  Diagnosis:   ICD-10-CM   1. PTSD (post-traumatic stress disorder)  F43.10     Plan: Patient is to use CBT and coping skills to decrease triggered responses.  Patient is to remind herself that when people criticize her that she does not agree with she does not have to give power to it.  She is also to work on her health and trying take care of herself appropriately. Long-term goal: Develop and implement effective coping skills to carry out normal responsibilities and participate constructively in relationships Short-term goal: Practice implement relaxation training as a coping mechanism for tension panic stress anger and anxiety-decrease intrusive thoughts by 50%  Stevphen Meuse, [facility]

## 2020-02-11 ENCOUNTER — Ambulatory Visit (INDEPENDENT_AMBULATORY_CARE_PROVIDER_SITE_OTHER): Payer: Self-pay | Admitting: Psychiatry

## 2020-02-11 ENCOUNTER — Other Ambulatory Visit: Payer: Self-pay

## 2020-02-11 DIAGNOSIS — F4310 Post-traumatic stress disorder, unspecified: Secondary | ICD-10-CM

## 2020-02-11 NOTE — Progress Notes (Signed)
[facility] Counselor/Therapist Progress Note  Patient ID: Jude Linck, MRN: [id number],    Date: 02/11/2020  Time Spent: 52 minutes  start time 11:04 AM end time 11:56 AM  Treatment Type: Individual Therapy  Reported Symptoms: anxiety, had a seizure, sadness  Mental Status Exam:  Appearance:   Well Groomed     Behavior:  Appropriate  Motor:  Normal  Speech/Language:   Normal Rate  Affect:  Appropriate  Mood:  normal  Thought process:  normal  Thought content:    WNL  Sensory/Perceptual disturbances:    WNL  Orientation:  oriented to person, place, time/date and situation  Attention:  Good  Concentration:  Good  Memory:  WNL  Fund of knowledge:   Good  Insight:    Good  Judgment:   Good  Impulse Control:  Good   Risk Assessment: Danger to Self:  No Self-injurious Behavior: No Danger to Others: No Duty to Warn:no Physical Aggression / Violence:No  Access to Firearms a concern: No  Gang Involvement:No   Subjective: Patient was present for session.  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  Patient recognized that the stress triggered the seizure so she is trying to figure out some ways to make that better at work.  Discussed the fact that at this point EMDR would not be a treatment option for fear of causing a seizure  in session..  It was agreed that patient can either go to once a month basis during this time or use sessions more for solution focused therapy.  Patient reported that at this point she needed to process lots of the different dynamics that were creating stress for her within her husband's family especially.  She shared that his grandmother's dementia is getting worse and it is creating stress for his uncle..  Patient went on to explain since there are not any other women in her in-laws situation currently all of the men come and communicate to her about their concerns and  frustrations which leaves a lot of stress within the dynamics.  Different strategies to help her disconnect from feeling the need to fix the situations were discussed with patient.  Patient was encouraged to allow them to have options but to recognize that they have to make the choices.  Patient is also to put up a board in her house where everybody who comes in can write something that they are thankful for to model that for the children but also to help change some of the messages that she gets constantly concerning problems.  Patient was also encouraged to write out for herself different successes and positives that occur so she can read that when she starts feeling weighted down with negativity.  Patient reported feeling very positive about suggestions from session and would be working on implementing them at home.  Interventions: Solution-Oriented/Positive Psychology  Diagnosis:   ICD-10-CM   1. PTSD (post-traumatic stress disorder)  F43.10     Plan: Patient is to use CBT and coping skills to decrease triggered responses.  Patient is to work on: writing out successes to read when she is feeling weighted down with negativity.  Patient is to remind herself that she can listen to her in-laws but does not need to fix their issues she can only give them choices/options in their situation.  Is going to continue focusing on taking care of herself during  her pregnancy especially. Long-term goal Develop and implement effective coping skills to carry out normal responsibilities and participate constructively in relationships  Short-term goal: Practice implement relaxation training as a coping mechanism for tension panic stress anger and anxiety-decrease intrusive thoughts by 50%  Stevphen Meuse,  [facility]

## 2020-02-19 ENCOUNTER — Inpatient Hospital Stay (HOSPITAL_COMMUNITY)
Admission: AD | Admit: 2020-02-19 | Discharge: 2020-02-19 | Disposition: A | Discharge status: Home / Self Care | Payer: Self-pay | Attending: Obstetrics and Gynecology | Admitting: Obstetrics and Gynecology

## 2020-02-19 ENCOUNTER — Other Ambulatory Visit: Payer: Self-pay

## 2020-02-19 ENCOUNTER — Encounter (HOSPITAL_COMMUNITY): Payer: Self-pay | Admitting: Obstetrics and Gynecology

## 2020-02-19 DIAGNOSIS — Z3A24 24 weeks gestation of pregnancy: Secondary | ICD-10-CM | POA: Insufficient documentation

## 2020-02-19 DIAGNOSIS — Z0371 Encounter for suspected problem with amniotic cavity and membrane ruled out: Secondary | ICD-10-CM | POA: Insufficient documentation

## 2020-02-19 DIAGNOSIS — O09522 Supervision of elderly multigravida, second trimester: Secondary | ICD-10-CM | POA: Insufficient documentation

## 2020-02-19 DIAGNOSIS — N898 Other specified noninflammatory disorders of vagina: Secondary | ICD-10-CM

## 2020-02-19 LAB — POCT FERN TEST: POCT Fern Test: NEGATIVE

## 2020-02-19 LAB — URINALYSIS, ROUTINE W REFLEX MICROSCOPIC
Bilirubin Urine: NEGATIVE
Glucose, UA: NEGATIVE mg/dL
Hgb urine dipstick: NEGATIVE
Ketones, ur: NEGATIVE mg/dL
Leukocytes,Ua: NEGATIVE
Nitrite: NEGATIVE
Protein, ur: NEGATIVE mg/dL
Specific Gravity, Urine: 1.008 (ref 1.005–1.030)
pH: 6 (ref 5.0–8.0)

## 2020-02-19 LAB — AMNISURE RUPTURE OF MEMBRANE (ROM) NOT AT ARMC: Amnisure ROM: NEGATIVE

## 2020-02-19 NOTE — Discharge Instructions (Signed)

## 2020-02-19 NOTE — MAU Provider Note (Addendum)
S: Ms. Amber Taylor is a 39 y.o. [phone] at [redacted]w[redacted]d  who presents to MAU today complaining of leaking of fluid since this morning. She describes a large gush of fluid that soaked her underwear and sheets. It had no odor or color; she is not sure if it is urine. She had intercourse yesterday but more than 24 hours ago. It has not continued. She wore a pantyliner and it was only "slightly moist". She denies vaginal bleeding. She denies contractions. She reports normal fetal movement.    O: BP 126/73   Pulse 89   Temp 98 F (36.7 C)   Resp 16   Ht 5\' 8"  (1.727 m)   Wt 73 kg   LMP 04/19/2019   SpO2 100%   BMI 24.48 kg/m  GENERAL: Well-developed, well-nourished female in no acute distress.  HEAD: Normocephalic, atraumatic.  CHEST: Normal effort of breathing, regular heart rate ABDOMEN: Soft, nontender, gravid PELVIC: Normal external female genitalia. Vagina is pink and rugated. Cervix with normal contour, no lesions. Normal discharge.  Negative pooling.   Cervical exam:   long, closed, anterior   Fetal Monitoring: Baseline: 140 Variability: mod Accelerations: present Decelerations: acel Contractions: none  Results for orders placed or performed during the hospital encounter of 02/19/20 (from the past 24 hour(s))  Urinalysis, Routine w reflex microscopic Urine, Clean Catch     Status: None   Collection Time: 02/19/20  8:19 AM  Result Value Ref Range   Color, Urine YELLOW YELLOW   APPearance CLEAR CLEAR   Specific Gravity, Urine 1.008 1.005 - 1.030   pH 6.0 5.0 - 8.0   Glucose, UA NEGATIVE NEGATIVE mg/dL   Hgb urine dipstick NEGATIVE NEGATIVE   Bilirubin Urine NEGATIVE NEGATIVE   Ketones, ur NEGATIVE NEGATIVE mg/dL   Protein, ur NEGATIVE NEGATIVE mg/dL   Nitrite NEGATIVE NEGATIVE   Leukocytes,Ua NEGATIVE NEGATIVE  Fern Test     Status: Normal   Collection Time: 02/19/20  8:52 AM  Result Value Ref Range   POCT Fern Test Negative = intact amniotic membranes   Amnisure rupture  of membrane (rom)not at [facility]     Status: None   Collection Time: 02/19/20  9:04 AM  Result Value Ref Range   Amnisure ROM NEGATIVE    Will collect Amnisure, although patient is without continued leakage in MAU> Amnisure is negative  A: [facility] at [redacted]w[redacted]d  Membranes intact  P: Discharge home.  Return precautions reviewed Keep follow up appt in september  October, Marylene Land 02/19/2020 9:50 AM

## 2020-02-19 NOTE — MAU Note (Signed)
. °  Amber Taylor is a 39 y.o. at [redacted]w[redacted]d here in MAU reporting: she had leakage of fluid and woke up with her bed wet around 430 this morning. Lower abdominal cramping at times. +FM  Onset of complaint: 0430 Pain score: 3 Vitals:   02/19/20 0815  BP: 126/73  Pulse: 89  Resp: 16  Temp: 98 F (36.7 C)  SpO2: 100%     FHT:140 Lab orders placed from triage: UA

## 2020-02-21 ENCOUNTER — Observation Stay (HOSPITAL_COMMUNITY): Payer: Self-pay

## 2020-02-21 ENCOUNTER — Encounter (HOSPITAL_COMMUNITY): Payer: Self-pay | Admitting: Radiology

## 2020-02-21 ENCOUNTER — Other Ambulatory Visit: Payer: Self-pay

## 2020-02-21 ENCOUNTER — Observation Stay (HOSPITAL_COMMUNITY)
Admission: AD | Admit: 2020-02-21 | Discharge: 2020-02-22 | Disposition: A | Discharge status: Home / Self Care | Payer: Self-pay | Attending: Obstetrics and Gynecology | Admitting: Obstetrics and Gynecology

## 2020-02-21 DIAGNOSIS — R112 Nausea with vomiting, unspecified: Secondary | ICD-10-CM | POA: Insufficient documentation

## 2020-02-21 DIAGNOSIS — Z20822 Contact with and (suspected) exposure to covid-19: Secondary | ICD-10-CM | POA: Insufficient documentation

## 2020-02-21 DIAGNOSIS — R4020 Unspecified coma: Secondary | ICD-10-CM | POA: Insufficient documentation

## 2020-02-21 DIAGNOSIS — X31XXXA Exposure to excessive natural cold, initial encounter: Secondary | ICD-10-CM | POA: Insufficient documentation

## 2020-02-21 DIAGNOSIS — Z3A25 25 weeks gestation of pregnancy: Secondary | ICD-10-CM

## 2020-02-21 DIAGNOSIS — R4189 Other symptoms and signs involving cognitive functions and awareness: Secondary | ICD-10-CM

## 2020-02-21 DIAGNOSIS — G40909 Epilepsy, unspecified, not intractable, without status epilepticus: Secondary | ICD-10-CM

## 2020-02-21 DIAGNOSIS — R1110 Vomiting, unspecified: Secondary | ICD-10-CM

## 2020-02-21 DIAGNOSIS — O99891 Other specified diseases and conditions complicating pregnancy: Principal | ICD-10-CM | POA: Insufficient documentation

## 2020-02-21 DIAGNOSIS — Z91010 Allergy to peanuts: Secondary | ICD-10-CM | POA: Insufficient documentation

## 2020-02-21 DIAGNOSIS — T68XXXA Hypothermia, initial encounter: Secondary | ICD-10-CM

## 2020-02-21 DIAGNOSIS — O99352 Diseases of the nervous system complicating pregnancy, second trimester: Secondary | ICD-10-CM

## 2020-02-21 DIAGNOSIS — R404 Transient alteration of awareness: Secondary | ICD-10-CM

## 2020-02-21 LAB — URINALYSIS, ROUTINE W REFLEX MICROSCOPIC
Bilirubin Urine: NEGATIVE
Glucose, UA: 150 mg/dL — AB
Hgb urine dipstick: NEGATIVE
Ketones, ur: 20 mg/dL — AB
Leukocytes,Ua: NEGATIVE
Nitrite: NEGATIVE
Protein, ur: 100 mg/dL — AB
Specific Gravity, Urine: 1.016 (ref 1.005–1.030)
pH: 7 (ref 5.0–8.0)

## 2020-02-21 LAB — TYPE AND SCREEN
ABO/RH(D): AB NEG
Antibody Screen: NEGATIVE

## 2020-02-21 LAB — RAPID URINE DRUG SCREEN, HOSP PERFORMED
Amphetamines: NOT DETECTED
Barbiturates: NOT DETECTED
Benzodiazepines: NOT DETECTED
Cocaine: NOT DETECTED
Opiates: NOT DETECTED
Tetrahydrocannabinol: NOT DETECTED

## 2020-02-21 LAB — CBC
HCT: 29.1 % — ABNORMAL LOW (ref 36.0–46.0)
Hemoglobin: 9.3 g/dL — ABNORMAL LOW (ref 12.0–15.0)
MCH: 30.1 pg (ref 26.0–34.0)
MCHC: 32 g/dL (ref 30.0–36.0)
MCV: 94.2 fL (ref 80.0–100.0)
Platelets: 153 10*3/uL (ref 150–400)
RBC: 3.09 MIL/uL — ABNORMAL LOW (ref 3.87–5.11)
RDW: 12.4 % (ref 11.5–15.5)
WBC: 7.2 10*3/uL (ref 4.0–10.5)
nRBC: 0 % (ref 0.0–0.2)

## 2020-02-21 LAB — COMPREHENSIVE METABOLIC PANEL
ALT: 16 U/L (ref 0–44)
AST: 16 U/L (ref 15–41)
Albumin: 2.2 g/dL — ABNORMAL LOW (ref 3.5–5.0)
Alkaline Phosphatase: 45 U/L (ref 38–126)
Anion gap: 5 (ref 5–15)
BUN: <5 mg/dL — ABNORMAL LOW (ref 6–20)
CO2: 23 mmol/L (ref 22–32)
Calcium: 7.2 mg/dL — ABNORMAL LOW (ref 8.9–10.3)
Chloride: 106 mmol/L (ref 98–111)
Creatinine, Ser: 0.56 mg/dL (ref 0.44–1.00)
GFR calc Af Amer: >60 mL/min (ref >60)
GFR calc non Af Amer: >60 mL/min (ref >60)
Glucose, Bld: 149 mg/dL — ABNORMAL HIGH (ref 70–99)
Potassium: 3 mmol/L — ABNORMAL LOW (ref 3.5–5.1)
Sodium: 134 mmol/L — ABNORMAL LOW (ref 135–145)
Total Bilirubin: 0.4 mg/dL (ref 0.3–1.2)
Total Protein: 4.8 g/dL — ABNORMAL LOW (ref 6.5–8.1)

## 2020-02-21 LAB — LIPASE, BLOOD: Lipase: 26 U/L (ref 11–51)

## 2020-02-21 LAB — LACTIC ACID, PLASMA: Lactic Acid, Venous: 1.7 mmol/L (ref 0.5–1.9)

## 2020-02-21 LAB — SARS CORONAVIRUS 2 BY RT PCR (HOSPITAL ORDER, PERFORMED IN ~~LOC~~ HOSPITAL LAB): SARS Coronavirus 2: NEGATIVE

## 2020-02-21 MED ORDER — ACETAMINOPHEN 325 MG PO TABS: 650.0000 mg | ORAL_TABLET | ORAL | Status: DC | PRN

## 2020-02-21 MED ORDER — LAMOTRIGINE 150 MG PO TABS: 600.0000 mg | ORAL_TABLET | Freq: Every day | ORAL | Status: DC

## 2020-02-21 MED ORDER — LACTATED RINGERS IV SOLN: INTRAVENOUS | Status: DC

## 2020-02-21 MED ORDER — ONDANSETRON HCL 4 MG/2ML IJ SOLN
4.0000 mg | Freq: Once | INTRAMUSCULAR | Status: AC
  Administered 2020-02-21: 4 mg via INTRAVENOUS

## 2020-02-21 MED ORDER — DOCUSATE SODIUM 100 MG PO CAPS
100.0000 mg | ORAL_CAPSULE | Freq: Every day | ORAL | Status: DC
  Filled 2020-02-21: qty 1

## 2020-02-21 MED ORDER — PRENATAL MULTIVITAMIN CH
1.0000 | ORAL_TABLET | Freq: Every day | ORAL | Status: DC
  Administered 2020-02-22: 1 via ORAL
  Filled 2020-02-21: qty 1

## 2020-02-21 MED ORDER — POLYSACCHARIDE IRON COMPLEX 150 MG PO CAPS
150.0000 mg | ORAL_CAPSULE | Freq: Every day | ORAL | Status: DC
  Administered 2020-02-22: 150 mg via ORAL
  Filled 2020-02-21: qty 1

## 2020-02-21 MED ORDER — ONDANSETRON 4 MG PO TBDP
4.0000 mg | ORAL_TABLET | Freq: Four times a day (QID) | ORAL | Status: DC | PRN
  Administered 2020-02-21: 4 mg via ORAL
  Filled 2020-02-21: qty 1

## 2020-02-21 MED ORDER — LAMOTRIGINE ER 200 MG PO TB24: 600.0000 mg | ORAL_TABLET | ORAL | Status: DC

## 2020-02-21 MED ORDER — LAMOTRIGINE 150 MG PO TABS
1000.0000 mg | ORAL_TABLET | Freq: Every day | ORAL | Status: DC
  Filled 2020-02-21: qty 1

## 2020-02-21 MED ORDER — ONDANSETRON HCL 4 MG/2ML IJ SOLN
INTRAMUSCULAR | Status: AC
  Filled 2020-02-21: qty 2

## 2020-02-21 MED ORDER — LACOSAMIDE 200 MG PO TABS
200.0000 mg | ORAL_TABLET | Freq: Two times a day (BID) | ORAL | Status: DC
  Administered 2020-02-21 – 2020-02-22 (×2): 200 mg via ORAL
  Filled 2020-02-21 (×3): qty 1

## 2020-02-21 MED ORDER — LAMOTRIGINE ER 200 MG PO TB24
1000.0000 mg | ORAL_TABLET | Freq: Every day | ORAL | Status: DC
  Filled 2020-02-21: qty 5

## 2020-02-21 MED ORDER — POTASSIUM CHLORIDE CRYS ER 20 MEQ PO TBCR
20.0000 meq | EXTENDED_RELEASE_TABLET | Freq: Three times a day (TID) | ORAL | Status: DC
  Administered 2020-02-21 – 2020-02-22 (×3): 20 meq via ORAL
  Filled 2020-02-21 (×3): qty 1

## 2020-02-21 MED ORDER — CALCIUM CARBONATE ANTACID 500 MG PO CHEW: 2.0000 | CHEWABLE_TABLET | ORAL | Status: DC | PRN

## 2020-02-21 MED ORDER — POTASSIUM CHLORIDE 10 MEQ/100ML IV SOLN
10.0000 meq | INTRAVENOUS | Status: AC
  Administered 2020-02-21 (×2): 10 meq via INTRAVENOUS
  Filled 2020-02-21 (×2): qty 100

## 2020-02-21 MED ORDER — LACTATED RINGERS IV BOLUS
1000.0000 mL | Freq: Once | INTRAVENOUS | Status: AC
  Administered 2020-02-21: 1000 mL via INTRAVENOUS

## 2020-02-21 MED ORDER — LAMOTRIGINE 150 MG PO TABS: 1000.0000 mg | ORAL_TABLET | Freq: Every day | ORAL | Status: DC

## 2020-02-21 MED ORDER — LAMOTRIGINE ER 200 MG PO TB24
600.0000 mg | ORAL_TABLET | Freq: Every day | ORAL | Status: DC
  Administered 2020-02-22: 600 mg via ORAL
  Filled 2020-02-21 (×2): qty 3

## 2020-02-21 MED ORDER — LORAZEPAM 0.5 MG PO TABS: 0.5000 mg | ORAL_TABLET | Freq: Three times a day (TID) | ORAL | Status: DC | PRN

## 2020-02-21 NOTE — MAU Note (Signed)
Lab bedside for blood cultures. MRI and xray called/pending.

## 2020-02-21 NOTE — MAU Provider Note (Addendum)
Chief Complaint:  Emesis   None     HPI: Amber MurrayHeidi Taylor is a 39 y.o. [id number] at 8847w1d by LMP who presents to maternity admissions via EMS for nausea and vomiting. Upon arrival to MAU, she was unresponsive to voice, touch, or even to pain stimuli but had normal respirations and other VS.  After IV fluids and approximately 30-45 minutes in MAU, pt became more responsive and was oriented but still sedated. Pt husband arrived and pt and her husband together able to give full history. Her child had GI virus last week, then she and her husband started having mild GI symptoms 2 days ago.  Pt husband and the child had negative COVID testing.  She did not eat much yesterday and did not eat or drink much this morning before taking her scheduled doses of Lamictal and Vimpat. Afterwards she became dizzy and started to have vomiting.  Her husband called EMS when she started to have LOC changes and did not seem oriented.  Both he and the patient report that she did not have a seizure.  This response to her medicines has occurred before, in 3 prior episodes in the last 7 years, but has never been this severe.  Two other times the patient accidentally took the wrong dose of the medication but today she took the medication as prescribed.       HPI  Past Medical History: Past Medical History:  Diagnosis Date  . Allergy   . AMA (advanced maternal age) primigravida 35+   . Complication of anesthesia    neurologist concerned about general anesthesia if needed  . Ectopic pregnancy, tubal 06/05/2019  . Eczema   . Headache   . Placenta previa antepartum in third trimester 01/28/2018   PLACENTA PREVIA:  General counseling was then performed regarding placenta previa.  Ninety percent of placenta previa diagnosed in the early second trimester will resolve by term.  There is an association with IUGR therefore serial U/S every 4 weeks for placental location and fetal growth is indicated.  Patients have a 4-8% risk of  recurrence in subsequent pregnancies.  . Polyhydramnios affecting pregnancy in third trimester 04/20/2016  . Seizures (HCC)   . Status post primary low transverse cesarean section 03/04/2018    Past obstetric history: OB History  Gravida Para Term Preterm AB Living  4 2 2   1 2   SAB TAB Ectopic Multiple Live Births      1 0 2    # Outcome Date GA Lbr Len/2nd Weight Sex Delivery Anes PTL Lv  4 Current           3 Ectopic 2020          2 Term 03/04/18    F CS-LTranv   LIV     Complications: Placenta previa  1 Term 04/28/16 779w2d 02:58 / 02:26 3330 g M Vag-Spont EPI  LIV    Past Surgical History: Past Surgical History:  Procedure Laterality Date  . CESAREAN SECTION N/A 03/04/2018   Procedure: CESAREAN SECTION;  Surgeon: Amber Taylor, Amber Worema, DO;  Location: [facility];  Service: Obstetrics;  Laterality: N/A;  Amber Taylor, RNFA Seizure disorder, MFM recommendation of delivery at 37wks, previa  Spoke with Amber StanleyLisa to add to this day  . CHOLECYSTECTOMY    . LAPAROSCOPIC UNILATERAL SALPINGECTOMY Left 06/05/2019   Procedure: LAPAROSCOPIC LEFT  SALPINGECTOMY WITH REMOVAL OF ECTOPIC PREGNANCY;  Surgeon: Amber ReinBovard-Stuckert, Jody, MD;  Location: [facility];  Service: Gynecology;  Laterality: Left;  . LAPAROSCOPY  N/A 06/05/2019   Procedure: LAPAROSCOPY OPERATIVE;  Surgeon: Amber Rein, MD;  Location: [facility] OR;  Service: Gynecology;  Laterality: N/A;  . WISDOM TOOTH EXTRACTION      Family History: Family History  Problem Relation Age of Onset  . Cancer Mother        breast  . Cancer Maternal Aunt   . Cancer Maternal Grandfather   . Heart disease Paternal Grandmother     Social History: Social History   Tobacco Use  . Smoking status: Never Smoker  . Smokeless tobacco: Never Used  Vaping Use  . Vaping Use: Never used  Substance Use Topics  . Alcohol use: Not Currently    Alcohol/week: 0.0 standard drinks  . Drug use: No    Allergies:  Allergies  Allergen Reactions  .  Peanut-Containing Drug Products     Feels ALL NUTS contraindicates medicine triggering seizures.  . Prednisone     Other reaction(s): Confusion (intolerance) Other reaction(s): Confusion (intolerance)   . Augmentin [Amoxicillin-Pot Clavulanate] Rash    Has patient had a PCN reaction causing immediate rash, facial/tongue/throat swelling, SOB or lightheadedness with hypotension: No Has patient had a PCN reaction causing severe rash involving mucus membranes or skin necrosis: No Has patient had a PCN reaction that required hospitalization: No Has patient had a PCN reaction occurring within the last 10 years: Yes--RASH ON LEGS ONLY If all of the above answers are "NO", then may proceed with Cephalosporin use.   Marland Kitchen Penicillins Rash    Meds:  Medications Prior to Admission  Medication Sig Dispense Refill Last Dose  . LAMICTAL XR 200 MG TB24 Take 600-1,000 mg by mouth See admin instructions. 600mg  in am and 1000mg  qhs  6   . Levomefolate Glucosamine (METHYLFOLATE PO) Take 800 mcg by mouth at bedtime.     LORazepam (ATIVAN) 0.5 MG tablet Take 1 tablet (0.5 mg total) by mouth every 8 (eight) hours as needed for seizure (seizure activity). 15 tablet 0   . Prenatal Vit-Fe Fumarate-FA (PRENATAL MULTIVITAMIN) TABS tablet Take 1 tablet by mouth daily at 12 noon. (Patient taking differently: Take 1 tablet by mouth at bedtime. ) 100 tablet 3   . VIMPAT 200 MG TABS tablet Take 200 mg by mouth 2 (two) times daily.  5     ROS:  Initially unable to perform ROS with pt unresponsive, but ROS performed when pt alert and oriented.  Review of Systems  Constitutional: Negative for chills and fever.  HENT: Positive for congestion. Negative for sore throat.   Eyes: Negative for blurred vision.  Respiratory: Negative for cough and shortness of breath.   Cardiovascular: Negative for chest pain and palpitations.  Gastrointestinal: Positive for diarrhea, nausea and vomiting. Negative for abdominal pain and  constipation.  Genitourinary: Negative for dysuria, frequency and urgency.  Musculoskeletal: Negative for falls, myalgias and neck pain.  Skin: Negative for rash.  Neurological: Positive for dizziness and loss of consciousness. Negative for sensory change, seizures and headaches.  Psychiatric/Behavioral: Negative for depression and memory loss. The patient is not nervous/anxious.     I have reviewed patient's Past Medical Hx, Surgical Hx, Family Hx, Social Hx, medications and allergies.   Physical Exam   Patient Vitals for the past 24 hrs:  BP Temp Temp src Pulse Resp SpO2  02/21/20 1556 (!) 95/53 -- -- 88 -- --  02/21/20 1545 (!) 89/49 -- -- 84 -- --  02/21/20 1530 (!) 90/52 -- -- 86 -- --  02/21/20 1515 (!)  93/57 -- -- 86 -- --  02/21/20 1500 (!) 91/50 -- -- 86 -- --  02/21/20 1445 (!) 87/52 97.6 F (36.4 C) Oral 86 -- --  02/21/20 1430 (!) 89/54 -- -- 90 -- --  02/21/20 1415 (!) 90/50 -- -- 84 -- --  02/21/20 1400 (!) 86/50 97.6 F (36.4 C) Oral 86 -- 98 %  02/21/20 1345 (!) 87/51 -- -- 84 -- --  02/21/20 1338 -- 97.6 F (36.4 C) Oral -- -- --  02/21/20 1330 (!) 89/53 -- -- 84 -- 99 %  02/21/20 1315 (!) 87/57 97.6 F (36.4 C) Oral 85 -- 99 %  02/21/20 1300 (!) 84/55 (!) 97.5 F (36.4 C) Oral 80 -- 99 %  02/21/20 1245 (!) 84/51 (!) 97.5 F (36.4 C) Oral -- 15 98 %  02/21/20 1230 (!) 82/55 (!) 97.4 F (36.3 C) Oral 87 -- 99 %  02/21/20 1217 (!) 88/55 -- -- 88 -- 99 %  02/21/20 1215 (!) 85/55 (!) 97.4 F (36.3 C) Oral 81 -- --  02/21/20 1214 -- (!) 97.4 F (36.3 C) Axillary -- -- 99 %  02/21/20 1200 (!) 89/51 -- -- 77 -- --  02/21/20 1152 -- (!) 97.5 F (36.4 C) Oral -- -- --  02/21/20 1145 (!) 92/51 -- -- 83 -- --  02/21/20 1126 (!) 93/52 -- -- 78 -- 98 %  02/21/20 1100 (!) 105/57 (!) 96.8 F (36 C) Rectal 84 14 94 %   Upon arrival to MAU:  Physical Exam Vitals and nursing note reviewed.  Constitutional:      Comments: Not alert, pale otherwise well appearing    HENT:     Head: Atraumatic.  Eyes:     Pupils: Pupils are equal, round, and reactive to light.  Cardiovascular:     Rate and Rhythm: Normal rate and regular rhythm.     Pulses: Normal pulses.     Heart sounds: Normal heart sounds.  Pulmonary:     Effort: Pulmonary effort is normal.     Breath sounds: Normal breath sounds.  Abdominal:     Palpations: Abdomen is soft.     Comments: Gravid abdomen appropriate for gestational age   Musculoskeletal:        General: Normal range of motion.     Cervical back: Normal range of motion.  Skin:    General: Skin is warm and dry.     Capillary Refill: Capillary refill takes less than 2 seconds.     Coloration: Skin is not pale.  Neurological:     Mental Status: She is alert and oriented to person, place, and time.     Comments: Unresponsive upon arrival to sound, touch, and mild pain  Psychiatric:        Mood and Affect: Mood normal.    After IV fluids in MAU: Physical Exam Constitutional:      Comments: Not alert, pale otherwise well appearing   HENT:     Head: Atraumatic.  Eyes:     Pupils: Pupils are equal, round, and reactive to light.  Cardiovascular:     Rate and Rhythm: Normal rate and regular rhythm.     Pulses: Normal pulses.     Heart sounds: Normal heart sounds.  Pulmonary:     Effort: Pulmonary effort is normal.     Breath sounds: Normal breath sounds.  Abdominal:     Palpations: Abdomen is soft.     Comments: Gravid abdomen appropriate for gestational age  Musculoskeletal:        General: Normal range of motion.     Cervical back: Normal range of motion.  Neurological:     Mental Status: She is alert and oriented to person, place, and time.     Comments: Unresponsive upon arrival to sound, touch, and mild pain  Skin:    General: Skin is warm and dry.     Capillary Refill: Capillary refill takes less than 2 seconds.     Coloration: Skin is not pale.  Psychiatric:        Mood and Affect: Mood normal.   Vitals and nursing note reviewed.        FHT:  Baseline 145 , moderate variability, accelerations present, no decelerations Contractions: irritability on toco, mild to palpation   Labs: Results for orders placed or performed during the hospital encounter of 02/21/20 (from the past 24 hour(s))  Urine rapid drug screen (hosp performed)     Status: None   Collection Time: 02/21/20 11:23 AM  Result Value Ref Range   Opiates NONE DETECTED NONE DETECTED   Cocaine NONE DETECTED NONE DETECTED   Benzodiazepines NONE DETECTED NONE DETECTED   Amphetamines NONE DETECTED NONE DETECTED   Tetrahydrocannabinol NONE DETECTED NONE DETECTED   Barbiturates NONE DETECTED NONE DETECTED  Urinalysis, Routine w reflex microscopic Urine, Catheterized     Status: Abnormal   Collection Time: 02/21/20 11:23 AM  Result Value Ref Range   Color, Urine YELLOW YELLOW   APPearance HAZY (A) CLEAR   Specific Gravity, Urine 1.016 1.005 - 1.030   pH 7.0 5.0 - 8.0   Glucose, UA 150 (A) NEGATIVE mg/dL   Hgb urine dipstick NEGATIVE NEGATIVE   Bilirubin Urine NEGATIVE NEGATIVE   Ketones, ur 20 (A) NEGATIVE mg/dL   Protein, ur 161 (A) NEGATIVE mg/dL   Nitrite NEGATIVE NEGATIVE   Leukocytes,Ua NEGATIVE NEGATIVE   RBC / HPF 0-5 0 - 5 RBC/hpf   WBC, UA 0-5 0 - 5 WBC/hpf   Bacteria, UA RARE (A) NONE SEEN   Squamous Epithelial / LPF 0-5 0 - 5   Mucus PRESENT   CBC     Status: Abnormal   Collection Time: 02/21/20 11:25 AM  Result Value Ref Range   WBC 7.2 4.0 - 10.5 K/uL   RBC 3.09 (L) 3.87 - 5.11 MIL/uL   Hemoglobin 9.3 (L) 12.0 - 15.0 g/dL   HCT 09.6 (L) 36 - 46 %   MCV 94.2 80.0 - 100.0 fL   MCH 30.1 26.0 - 34.0 pg   MCHC 32.0 30.0 - 36.0 g/dL   RDW 04.5 40.9 - 81.1 %   Platelets 153 150 - 400 K/uL   nRBC 0.0 0.0 - 0.2 %  Comprehensive metabolic panel     Status: Abnormal   Collection Time: 02/21/20 11:25 AM  Result Value Ref Range   Sodium 134 (L) 135 - 145 mmol/L   Potassium 3.0 (L) 3.5 - 5.1 mmol/L    Chloride 106 98 - 111 mmol/L   CO2 23 22 - 32 mmol/L   Glucose, Bld 149 (H) 70 - 99 mg/dL   BUN <5 (L) 6 - 20 mg/dL   Creatinine, Ser 9.14 0.44 - 1.00 mg/dL   Calcium 7.2 (L) 8.9 - 10.3 mg/dL   Total Protein 4.8 (L) 6.5 - 8.1 g/dL   Albumin 2.2 (L) 3.5 - 5.0 g/dL   AST 16 15 - 41 U/L   ALT 16 0 - 44 U/L   Alkaline Phosphatase  45 38 - 126 U/L   Total Bilirubin 0.4 0.3 - 1.2 mg/dL   GFR calc non Af Amer >60 >60 mL/min   GFR calc Af Amer >60 >60 mL/min   Anion gap 5 5 - 15  Lipase, blood     Status: None   Collection Time: 02/21/20 11:25 AM  Result Value Ref Range   Lipase 26 11 - 51 U/L  Lactic acid, plasma     Status: None   Collection Time: 02/21/20 11:32 AM  Result Value Ref Range   Lactic Acid, Venous 1.7 0.5 - 1.9 mmol/L  SARS Coronavirus 2 by RT PCR (hospital order, performed in [facility] hospital lab) Nasopharyngeal Nasopharyngeal Swab     Status: None   Collection Time: 02/21/20 11:46 AM   Specimen: Nasopharyngeal Swab  Result Value Ref Range   SARS Coronavirus 2 NEGATIVE NEGATIVE  Type and screen [facility]     Status: None   Collection Time: 02/21/20  2:44 PM  Result Value Ref Range   ABO/RH(D) AB NEG    Antibody Screen NEG    Sample Expiration      02/24/2020,2359 Performed at [facility], [address] [place], [place] [address]    --/--/AB NEG (09/12 1444)  Imaging:  No results found.  MAU Course/MDM: Orders Placed This Encounter  Procedures  . SARS Coronavirus 2 by RT PCR (hospital order, performed in [facility] hospital lab) Nasopharyngeal Nasopharyngeal Swab  . Culture, blood (routine x 2)  . MR BRAIN WO CONTRAST  . DG Chest Port 1 View  . CBC  . Comprehensive metabolic panel  . Urine rapid drug screen (hosp performed)  . Lipase, blood  . Lactic acid, plasma  . Urinalysis, Routine w reflex microscopic  . Lacosamide  . Lamotrigine level  . Diet regular Room service appropriate? Yes; Fluid consistency: Thin  . In and  Out Cath  . Notify physician (specify)  . Vital signs  . Defer vaginal exam for vaginal bleeding or PROM <37 weeks  . Initiate Oral Care Protocol  . Initiate Carrier Fluid Protocol  . Assess fetal heart tones by doppler  . Activity as tolerated  . Full code  . Type and screen [facility]  . Place in observation (patient's expected length of stay will be less than 2 midnights)    Meds ordered this encounter  Medications  . lactated ringers bolus 1,000 mL  . ondansetron (ZOFRAN) injection 4 mg  . ondansetron (ZOFRAN) 4 MG/2ML injection    Randa Evens, Ch: cabinet override  . lactated ringers infusion  . potassium chloride 10 mEq in 100 mL IVPB  . LORazepam (ATIVAN) tablet 0.5 mg  . LamoTRIgine 24 hour tablet TB24 600-1,000 mg  . lacosamide (VIMPAT) tablet 200 mg  . acetaminophen (TYLENOL) tablet 650 mg  . docusate sodium (COLACE) capsule 100 mg  . calcium carbonate (TUMS - dosed in mg elemental calcium) chewable tablet 400 mg of elemental calcium  . prenatal multivitamin tablet 1 tablet  . ondansetron (ZOFRAN-ODT) disintegrating tablet 4 mg  . potassium chloride SA (KLOR-CON) CR tablet 20 mEq  . iron polysaccharides (NIFEREX) capsule 150 mg     NST reviewed and appropriate for gestational age Upon arrival to MAU, pt unresponsive to stimuli, including pain but with normal VS, except for hypothermia at 96.8 rectally.  FHR tracing also wnl when monitors applied IV started by EMS with most of 1000 ml bag of NS given, second  bag of LR started in MAU and labs drawn by phlebotomy Dr Donavan Foil notified and he and rapid response RN to room Pt slowly increased LOC and responded to voice within minutes of arrival in MAU Consult neurology and talked with Dr Wilford Corner about presentation, exam findings and test results.  Lamictal may cause changes in LOC but unlikely cause of hypothermia which is persistent in MAU Admit for observation per Dr Gwenevere Abbot Dr Jackelyn Knife who admitted  pt to [facility] Unit for observation MRI, CXR ordered per neurology Blood cultures x 2, Potassium IV, 10 meq x 2, Zofran 4 mg Q 6 hours     Assessment: 1. 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 Emesis   3. Change in level of consciousness   4. Episode of unresponsiveness   5. Hypothermia, initial encounter     Plan: Admit for observation to [facility] Unit Dr Jackelyn Knife to assume care of patient Lamictal and Vimpat serum levels pending Imaging, both CXR and MRI brain results pending Blood cultures x 2 pending Zofran 4 mg IV Q 6 hours for nausea   Sharen Counter Certified Nurse-Midwife 02/21/2020 4:00 PM

## 2020-02-21 NOTE — MAU Note (Signed)
Patient requesting ginger ale and crackers. Ok per [employer].

## 2020-02-21 NOTE — MAU Note (Signed)
Patient to admit bed via stretcher with RN.

## 2020-02-21 NOTE — H&P (Signed)
Chief Complaint: Emesis    HPI: Amber Taylor is a 39 y.o. [id number] at [redacted]w[redacted]d by LMP who presents to maternity admissions via EMS for nausea and vomiting. Upon arrival to MAU, she was unresponsive to voice, touch, or even to pain stimuli but had normal respirations and other VS. After IV fluids and approximately 30-45 minutes in MAU, pt became more responsive and was oriented but still sedated. Pt husband arrived and pt and her husband together able to give full history. Her child had GI virus last week, then she and her husband started having mild GI symptoms 2 days ago. Pt husband and the child had negative COVID testing. She did not eat much yesterday and did not eat or drink much this morning before taking her scheduled doses of Lamictal and Vimpat. Afterwards she became dizzy and started to have vomiting. Her husband called EMS when she started to have LOC changes and did not seem oriented. Both he and the patient report that she did not have a seizure. This response to her medicines has occurred before, in 3 prior episodes in the last 7 years, but has never been this severe. Two other times the patient accidentally took the wrong dose of the medication but today she took the medication as prescribed.  HPI  Past Medical History:      Past Medical History:  Diagnosis Date  . Allergy   . AMA (advanced maternal age) primigravida 35+   . Complication of anesthesia    neurologist concerned about general anesthesia if needed  . Ectopic pregnancy, tubal 06/05/2019  . Eczema   . Headache   . Placenta previa antepartum in third trimester 01/28/2018   PLACENTA PREVIA: General counseling was then performed regarding placenta previa. Ninety percent of placenta previa diagnosed in the early second trimester will resolve by term. There is an association with IUGR therefore serial U/S every 4 weeks for placental location and fetal growth is indicated. Patients have a 4-8% risk of recurrence in subsequent  pregnancies.  . Polyhydramnios affecting pregnancy in third trimester 04/20/2016  . Seizures (HCC)   . Status post primary low transverse cesarean section 03/04/2018   Past obstetric history:                  OB History  Gravida Para Term Preterm AB Living  4 2 2  1 2   SAB TAB Ectopic Multiple Live Births       1 0 2       # Outcome Date GA Lbr Len/2nd Weight Sex Delivery Anes PTL Lv  4 Current           3 Ectopic 2020          2 Term 03/04/18    F CS-LTranv   LIV  Complications: Placenta previa  1 Term 04/28/16 [redacted]w[redacted]d 02:58 / 02:26 3330 g M Vag-Spont EPI  LIV   Past Surgical History:       Past Surgical History:  Procedure Laterality Date  . CESAREAN SECTION N/A 03/04/2018   Procedure: CESAREAN SECTION; Surgeon: 03/06/2018, DO; Location: [facility]; Service: Obstetrics; Laterality: N/A; Heather, RNFA  Seizure disorder, MFM recommendation of delivery at 37wks, previa  Spoke with Edwinna Areola to add to this day  . CHOLECYSTECTOMY    . LAPAROSCOPIC UNILATERAL SALPINGECTOMY Left 06/05/2019   Procedure: LAPAROSCOPIC LEFT SALPINGECTOMY WITH REMOVAL OF ECTOPIC PREGNANCY; Surgeon: 06/07/2019, MD; Location: [facility]; Service: Gynecology; Laterality: Left;  . LAPAROSCOPY N/A 06/05/2019  Procedure: LAPAROSCOPY OPERATIVE; Surgeon: Sherian Rein, MD; Location: [facility] OR; Service: Gynecology; Laterality: N/A;  . WISDOM TOOTH EXTRACTION     Family History:       Family History  Problem Relation Age of Onset  . Cancer Mother    breast  . Cancer Maternal Aunt   . Cancer Maternal Grandfather   . Heart disease Paternal Grandmother    Social History:  Social History        Tobacco Use  . Smoking status: Never Smoker  . Smokeless tobacco: Never Used  Vaping Use  . Vaping Use: Never used  Substance Use Topics  . Alcohol use: Not Currently    Alcohol/week: 0.0 standard drinks  . Drug use: No   Allergies:       Allergies  Allergen Reactions  .  Peanut-Containing Drug Products     Feels ALL NUTS contraindicates medicine triggering seizures.  . Prednisone     Other reaction(s): Confusion (intolerance)  Other reaction(s): Confusion (intolerance)   . Augmentin [Amoxicillin-Pot Clavulanate] Rash    Has patient had a PCN reaction causing immediate rash, facial/tongue/throat swelling, SOB or lightheadedness with hypotension: No  Has patient had a PCN reaction causing severe rash involving mucus membranes or skin necrosis: No  Has patient had a PCN reaction that required hospitalization: No  Has patient had a PCN reaction occurring within the last 10 years: Yes--RASH ON LEGS ONLY  If all of the above answers are "NO", then may proceed with Cephalosporin use.   Marland Kitchen Penicillins Rash   Meds:         Medications Prior to Admission  Medication Sig Dispense Refill Last Dose  . LAMICTAL XR 200 MG TB24 Take 600-1,000 mg by mouth See admin instructions. 600mg  in am and 1000mg  qhs  6   . Levomefolate Glucosamine (METHYLFOLATE PO) Take 800 mcg by mouth at bedtime.     LORazepam (ATIVAN) 0.5 MG tablet Take 1 tablet (0.5 mg total) by mouth every 8 (eight) hours as needed for seizure (seizure activity). 15 tablet 0   . Prenatal Vit-Fe Fumarate-FA (PRENATAL MULTIVITAMIN) TABS tablet Take 1 tablet by mouth daily at 12 noon. (Patient taking differently: Take 1 tablet by mouth at bedtime. ) 100 tablet 3   . VIMPAT 200 MG TABS tablet Take 200 mg by mouth 2 (two) times daily.  5    ROS:  Initially unable to perform ROS with pt unresponsive, but ROS performed when pt alert and oriented.  Review of Systems  Constitutional: Negative for chills and fever.  HENT: Positive for congestion. Negative for sore throat.  Eyes: Negative for blurred vision.  Respiratory: Negative for cough and shortness of breath.  Cardiovascular: Negative for chest pain and palpitations.  Gastrointestinal: Positive for diarrhea, nausea and vomiting. Negative for abdominal pain and  constipation.  Genitourinary: Negative for dysuria, frequency and urgency.  Musculoskeletal: Negative for falls, myalgias and neck pain.  Skin: Negative for rash.  Neurological: Positive for dizziness and loss of consciousness. Negative for sensory change, seizures and headaches.  Psychiatric/Behavioral: Negative for depression and memory loss. The patient is not nervous/anxious.   02/21/20 1445 (!) 87/52 97.6 F (36.4 C) Oral 86 -- --   Upon arrival to MAU:  Physical Exam  Vitals and nursing note reviewed.  Constitutional:  Comments: Not alert, pale otherwise well appearing  HENT:  Head: Atraumatic.  Eyes:  Pupils: Pupils are equal, round, and reactive to light.  Cardiovascular:  Rate and Rhythm: Normal rate and  regular rhythm.  Pulses: Normal pulses.  Heart sounds: Normal heart sounds.  Pulmonary:  Effort: Pulmonary effort is normal.  Breath sounds: Normal breath sounds.  Abdominal:  Palpations: Abdomen is soft.  Comments: Gravid abdomen appropriate for gestational age Musculoskeletal:  General: Normal range of motion.  Cervical back: Normal range of motion.  Skin:  General: Skin is warm and dry.  Capillary Refill: Capillary refill takes less than 2 seconds.  Coloration: Skin is not pale.  Neurological:  Mental Status: She is alert and oriented to person, place, and time.  Comments: Unresponsive upon arrival to sound, touch, and mild pain  Psychiatric:  Mood and Affect: Mood normal.   After IV fluids in MAU:  Physical Exam  Constitutional:  Comments: Not alert, pale otherwise well appearing  HENT:  Head: Atraumatic.  Eyes:  Pupils: Pupils are equal, round, and reactive to light.  Cardiovascular:  Rate and Rhythm: Normal rate and regular rhythm.  Pulses: Normal pulses.  Heart sounds: Normal heart sounds.  Pulmonary:  Effort: Pulmonary effort is normal.  Breath sounds: Normal breath sounds.  Abdominal:  Palpations: Abdomen is soft.  Comments: Gravid  abdomen appropriate for gestational age Musculoskeletal:  General: Normal range of motion.  Cervical back: Normal range of motion.  Neurological:  Mental Status: She is alert and oriented to person, place, and time.  Comments: Unresponsive upon arrival to sound, touch, and mild pain  Skin:  General: Skin is warm and dry.  Capillary Refill: Capillary refill takes less than 2 seconds.  Coloration: Skin is not pale.  Psychiatric:  Mood and Affect: Mood normal.  Vitals and nursing note reviewed.    FHT: Baseline 145 , moderate variability, accelerations present, no decelerations  Contractions: irritability on toco, mild to palpation   A/P:    NST reviewed and appropriate for gestational age  Upon arrival to MAU, pt unresponsive to stimuli, including pain but with normal VS, except for hypothermia at 96.8 rectally. FHR tracing also wnl when monitors applied  IV started by EMS with most of 1000 ml bag of NS given, second bag of LR started in MAU and labs drawn by phlebotomy ,   Pt slowly increased LOC and responded to voice within minutes of arrival in MAU  Consult neurology and talked with Dr Wilford Corner about presentation, exam findings and test results. Lamictal may cause changes in LOC but unlikely cause of hypothermia which is persistent in MAU   Called Dr Jackelyn Knife who admitted pt to [facility] Unit for observation  MRI of brain is normal, CXR essentially normal, possible early abnormality RLL   Blood cultures x 2, Potassium replacement IV and PO Assessment:  1. 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 Emesis   3. Change in level of consciousness   4. Episode of unresponsiveness   5. Hypothermia, initial encounter    Plan:  Admit for observation to [facility] Unit mostly due to mild hypothermia that is not explained Lamictal and Vimpat serum levels pending  Blood cultures x 2 pending

## 2020-02-21 NOTE — MAU Note (Signed)
Patient to MRI via stretcher.

## 2020-02-21 NOTE — MAU Note (Signed)
Patient arousable. Answering questions to person, time, and place. Conversing with Fleet Contras and Dr. Donavan Foil whom are at bedside.

## 2020-02-21 NOTE — MAU Note (Signed)
Patient reports that she feels very hot with bear hugger on. Per Misty Stanley CNM ok to trial patient off the bear hugger.

## 2020-02-21 NOTE — Significant Event (Signed)
Received a call from this patient's RN asking for a second set of eyes on her patient.  She was brought in by EMS from her home.  Everyone in the household has had a GI bug that has been going around.  Patient admits that she has had NV for the past 2-3 day.  When she arrived she was hypotensive, hypothermic, and unresponsive to sternal rub.  The RN stated that her color looked off too.  Patient was stating that she "need IVs to flush it out of her system."  Patient takes Lacosimide for seizures.  According to the husband, he did not think that she took more than she should.  CNMW is ordering labs, covid swab, 2L of IV fluids given, bear-hugger applied, zofran given for nausea.  RNs bedside monitoring fetal heart tones.

## 2020-02-21 NOTE — MAU Note (Signed)
Patient husband at bedside.  Reports to RN that this has happened in the past and that she is 'very sensitive' to medication. She has not been eating or drinking well. Endorses that she missed her 530 dose and may have taken both doses of her seizure medication at the same time this morning.  Patient tells RN that it last happened in April of 2019 and that she felt better after a lot of 'IV's'. States she feels really sleepy and dizzy.

## 2020-02-21 NOTE — MAU Note (Signed)
Rapid Response called. Bear hugger initiated for low temperature.

## 2020-02-21 NOTE — MAU Note (Addendum)
Patient arrived via EMS. EMS reports that there is a "stomach bug" going around the patients home. Patient + for N/V/D.  This am she took her Lamictal on an empty stomach and began to vomit. Hypotensive on the truck 90's over 50's.  On arrival: 9 G IV with NS infusing in Right arm. Patient not responding to RN questions. Very drowsy. Limited response with sternal rub. EMS reports last baseline LOC around 7am. 1 episode of vomiting (green). FHR 120's. Sharen Counter CNM called to bedside.

## 2020-02-21 NOTE — MAU Note (Signed)
Patient O2 Sats 94% RA. Still not responding to RN questions/stimuli.  Per CNM 2 L/[place] started. O2 sats increased to 96/98%. Donette Larry CNM bedside.

## 2020-02-22 LAB — CBC
HCT: 29.6 % — ABNORMAL LOW (ref 36.0–46.0)
Hemoglobin: 9.4 g/dL — ABNORMAL LOW (ref 12.0–15.0)
MCH: 30.1 pg (ref 26.0–34.0)
MCHC: 31.8 g/dL (ref 30.0–36.0)
MCV: 94.9 fL (ref 80.0–100.0)
Platelets: 197 10*3/uL (ref 150–400)
RBC: 3.12 MIL/uL — ABNORMAL LOW (ref 3.87–5.11)
RDW: 12.6 % (ref 11.5–15.5)
WBC: 6.9 10*3/uL (ref 4.0–10.5)
nRBC: 0 % (ref 0.0–0.2)

## 2020-02-22 LAB — BASIC METABOLIC PANEL
Anion gap: 7 (ref 5–15)
BUN: 6 mg/dL (ref 6–20)
CO2: 24 mmol/L (ref 22–32)
Calcium: 7.9 mg/dL — ABNORMAL LOW (ref 8.9–10.3)
Chloride: 109 mmol/L (ref 98–111)
Creatinine, Ser: 0.57 mg/dL (ref 0.44–1.00)
GFR calc Af Amer: >60 mL/min (ref >60)
GFR calc non Af Amer: >60 mL/min (ref >60)
Glucose, Bld: 98 mg/dL (ref 70–99)
Potassium: 3.6 mmol/L (ref 3.5–5.1)
Sodium: 140 mmol/L (ref 135–145)

## 2020-02-22 LAB — GLUCOSE, CAPILLARY: Glucose-Capillary: 148 mg/dL — ABNORMAL HIGH (ref 70–99)

## 2020-02-22 MED ORDER — ACETAMINOPHEN 325 MG PO TABS
650.0000 mg | ORAL_TABLET | ORAL | 0 refills | Status: DC | PRN
Start: 2020-02-22 — End: 2020-05-30

## 2020-02-22 NOTE — Progress Notes (Signed)
Discharge instructions and prescriptions given to pt. Discussed signs and symptoms to report to the MD, upcoming appointments, and meds. Pt verbalizes understanding and has no questions or concerns at this time. Home meds returned to pt. Pt discharged from hospital in stable condition.

## 2020-02-22 NOTE — Progress Notes (Signed)
Patient ID: Amber Taylor, female   DOB: 1980/08/15, 39 y.o.   MRN: [id number] Pt reports good day, no aura and feels well Ate earlier with no nausea and no diarrhea  afeb VSS  Gravid NT  Pt much improved with hydration and replacement of potassium Discussed events pts neurologist, Dr. Radford Pax and he agrees pt can be managed outpatient.  He requests her next lamictal level at 28-29 weeks and be copied to him.   Reviewed with pt and she is agreeable to this.  She knows to report any increased aura or neurologic sx.  D/c home on same dose of lamictal per neurology

## 2020-02-22 NOTE — Progress Notes (Signed)
Patient ID: Amber Taylor, female   DOB: 1981-01-23, 39 y.o.   MRN: [id number] HD #2 Pt reports feeling much better this AM.  Tolerated po intake last pm with zofran.  No nausea this AM but has not eaten yet. She declined her lamictal last night because felt still was running overloaded, but took full dose this AM.  No aura  Temp and VSS Gravid NT  Potassium back to normal at 3.6 Mild anemia but no other issues.  Pt with improved altered mental state s/p hydration, potassium correction and holding one dose of lamicatal w/u WNL and lamictal/vimpat levels pending. Has ativan back up IV for if develops aura Will consult with her neurologist today and see if he feels she can be managed safely outpatient while levels pending.  Pt encouraged to eat soon

## 2020-02-22 NOTE — Discharge Summary (Signed)
Physician Discharge Summary  Patient ID: Amber Taylor MRN: [id number] DOB/AGE: [birth year] 39 y.o.  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  She was taking her regular doses of lamictal and feels she likely got a bit high on her blood levels as well given her illness. She had extensive w/u with negative head MRI, CXR, blood cultures and labs.  All were negative except the hypokalemia.  Once she was hydrated and her potassium replaced she began to feel improved.  The day of d/c she had no aura and normal neurologic function.  I consulted her neurologist and they were agreeable for her to be discharged with next lamictal level planned at 28 weeks.      Significant Diagnostic Studies: radiology: MRI: of head WNL, CXR normal  Treatments: IV hydration  Discharge Exam: Blood pressure (!) 102/56, pulse 88, temperature 98.2 F (36.8 C), temperature source Oral, resp. rate 17, last menstrual period 04/19/2019, SpO2 98 %, unknown if currently breastfeeding. General appearance: alert and cooperative GI: soft NT gravid  Disposition: Discharge disposition: 01-Home or Self Care       Discharge Instructions    Diet - low sodium heart healthy   Complete by: As directed    Discharge instructions   Complete by: As directed    Call if unable to tolerate seizure medications or increased nausea and vomiting.  Call if experiencing aura.     Allergies as of 02/22/2020      Reactions   Peanut-containing Drug Products    Feels ALL NUTS contraindicates medicine triggering seizures.   Prednisone    Other reaction(s): Confusion  (intolerance) Other reaction(s): Confusion (intolerance)   Augmentin [amoxicillin-pot Clavulanate] Rash   Has patient had a PCN reaction causing immediate rash, facial/tongue/throat swelling, SOB or lightheadedness with hypotension: No Has patient had a PCN reaction causing severe rash involving mucus membranes or skin necrosis: No Has patient had a PCN reaction that required hospitalization: No Has patient had a PCN reaction occurring within the last 10 years: Yes--RASH ON LEGS ONLY If all of the above answers are "NO", then may proceed with Cephalosporin use.   Penicillins Rash      Medication List    TAKE these medications   acetaminophen 325 MG tablet Commonly known as: TYLENOL Take 2 tablets (650 mg total) by mouth every 4 (four) hours as needed (for pain scale < 4  OR  temperature  >/=  100.5 F).   LaMICtal XR 200 MG Tb24 24 hour tablet Generic drug: LamoTRIgine Take 600-1,000 mg by mouth See admin instructions. 600mg  in am and 1000mg  qhs   LORazepam 0.5 MG tablet Commonly known as: ATIVAN Take 1 tablet (0.5 mg total) by mouth every 8 (eight) hours as needed for seizure (seizure activity).   METHYLFOLATE PO Take 800 mcg by mouth at bedtime.   prenatal multivitamin Tabs tablet Take 1 tablet by mouth daily at 12 noon. What changed: when to take this   Vimpat 200 MG Tabs tablet Generic drug: lacosamide Take 200 mg by mouth 2 (two) times daily.       Follow-up Information    Shivaji, , MD Follow up.   Specialty:  Obstetrics and Gynecology Why: Keep appointment as planned in next 2 weeks Contact information: [address] [place] [address] [place] [address] [phone]               Signed: Oliver Pila 02/22/2020, 4:06 PM

## 2020-02-22 NOTE — Discharge Instructions (Signed)
Second Trimester of Pregnancy  The second trimester is from week 14 through week 27 (month 4 through 6). This is often the time in pregnancy that you feel your best. Often times, morning sickness has lessened or quit. You may have more energy, and you may get hungry more often. Your unborn baby is growing rapidly. At the end of the sixth month, he or she is about 9 inches long and weighs about 1 pounds. You will likely feel the baby move between 18 and 20 weeks of pregnancy. Follow these instructions at home: Medicines  Take over-the-counter and prescription medicines only as told by your doctor. Some medicines are safe and some medicines are not safe during pregnancy.  Take a prenatal vitamin that contains at least 600 micrograms (mcg) of folic acid.  If you have trouble pooping (constipation), take medicine that will make your stool soft (stool softener) if your doctor approves. Eating and drinking   Eat regular, healthy meals.  Avoid raw meat and uncooked cheese.  If you get low calcium from the food you eat, talk to your doctor about taking a daily calcium supplement.  Avoid foods that are high in fat and sugars, such as fried and sweet foods.  If you feel sick to your stomach (nauseous) or throw up (vomit): ? Eat 4 or 5 small meals a day instead of 3 large meals. ? Try eating a few soda crackers. ? Drink liquids between meals instead of during meals.  To prevent constipation: ? Eat foods that are high in fiber, like fresh fruits and vegetables, whole grains, and beans. ? Drink enough fluids to keep your pee (urine) clear or pale yellow. Activity  Exercise only as told by your doctor. Stop exercising if you start to have cramps.  Do not exercise if it is too hot, too humid, or if you are in a place of great height (high altitude).  Avoid heavy lifting.  Wear low-heeled shoes. Sit and stand up straight.  You can continue to have sex unless your doctor tells you not  to. Relieving pain and discomfort  Wear a good support bra if your breasts are tender.  Take warm water baths (sitz baths) to soothe pain or discomfort caused by hemorrhoids. Use hemorrhoid cream if your doctor approves.  Rest with your legs raised if you have leg cramps or low back pain.  If you develop puffy, bulging veins (varicose veins) in your legs: ? Wear support hose or compression stockings as told by your doctor. ? Raise (elevate) your feet for 15 minutes, 3-4 times a day. ? Limit salt in your food. Prenatal care  Write down your questions. Take them to your prenatal visits.  Keep all your prenatal visits as told by your doctor. This is important. Safety  Wear your seat belt when driving.  Make a list of emergency phone numbers, including numbers for family, friends, the hospital, and police and fire departments. General instructions  Ask your doctor about the right foods to eat or for help finding a counselor, if you need these services.  Ask your doctor about local prenatal classes. Begin classes before month 6 of your pregnancy.  Do not use hot tubs, steam rooms, or saunas.  Do not douche or use tampons or scented sanitary pads.  Do not cross your legs for long periods of time.  Visit your dentist if you have not done so. Use a soft toothbrush to brush your teeth. Floss gently.  Avoid all smoking, herbs,   and alcohol. Avoid drugs that are not approved by your doctor.  Do not use any products that contain nicotine or tobacco, such as cigarettes and e-cigarettes. If you need help quitting, ask your doctor.  Avoid cat litter boxes and soil used by cats. These carry germs that can cause birth defects in the baby and can cause a loss of your baby (miscarriage) or stillbirth. Contact a doctor if:  You have mild cramps or pressure in your lower belly.  You have pain when you pee (urinate).  You have bad smelling fluid coming from your vagina.  You continue to  feel sick to your stomach (nauseous), throw up (vomit), or have watery poop (diarrhea).  You have a nagging pain in your belly area.  You feel dizzy. Get help right away if:  You have a fever.  You are leaking fluid from your vagina.  You have spotting or bleeding from your vagina.  You have severe belly cramping or pain.  You lose or gain weight rapidly.  You have trouble catching your breath and have chest pain.  You notice sudden or extreme puffiness (swelling) of your face, hands, ankles, feet, or legs.  You have not felt the baby move in over an hour.  You have severe headaches that do not go away when you take medicine.  You have trouble seeing. Summary  The second trimester is from week 14 through week 27 (months 4 through 6). This is often the time in pregnancy that you feel your best.  To take care of yourself and your unborn baby, you will need to eat healthy meals, take medicines only if your doctor tells you to do so, and do activities that are safe for you and your baby.  Call your doctor if you get sick or if you notice anything unusual about your pregnancy. Also, call your doctor if you need help with the right food to eat, or if you want to know what activities are safe for you. This information is not intended to replace advice given to you by your health care provider. Make sure you discuss any questions you have with your health care provider. Document Revised: 09/19/2018 Document Reviewed: 07/03/2016 Elsevier Patient Education  2020 [employer].   Pregnancy and Epilepsy  Epilepsy is a condition in which a person has repeated seizures over time. Epilepsy can cause problems during pregnancy, but with the right care the chances of having a normal, healthy baby are good. Most women with epilepsy have normal pregnancies and healthy babies. What kind of problems can epilepsy cause during pregnancy? The problems that may happen due to epilepsy depend on  whether or not you take medicines for your condition during your pregnancy. Without treatment, epilepsy can put you and your baby at risk for certain problems, including:  More frequent seizures than usual.  Placenta problems.  Premature labor and birth.  Slowed heart rate and low oxygen supply to the baby.  Slower than normal growth in the uterus (intrauterine growth restriction). How is epilepsy treated during pregnancy? During pregnancy, treatment for epilepsy may include:  Taking a medicine that helps prevent seizures (antiepileptic drug). This medicine is commonly prescribed for women who have had seizures within several years of the pregnancy.  Taking a higher than normal doses of folic acid. Folic acid helps prevent your baby from developing spinal cord defects. Working with your health care provider and going to all your prenatal appointments is an important part of treatment. Will my  medicine affect my baby? Antiepileptic medicines can affect you and your baby by increasing the risk for:  Vaginal bleeding early in pregnancy.  High blood pressure during pregnancy (preeclampsia).  Birth defects.  Labor induction. This is when you fail to go into labor, and labor has to be started for you.  A cesarean delivery.  Complications after birth. Most of the time, medicines are prescribed anyway because the risk of the medicines harming your baby is lower than the risk of a seizure harming your baby. If your health care provider prescribes this medicine, he or she will prescribe the safest kind of medicine at the lowest dose that will still prevent seizures. You will also have blood tests often to make sure that the medicine is at a safe level. What if I have a seizure while I am pregnant? Women with epilepsy are more likely to have seizures during pregnancy due to hormonal changes, stress, or a lower dose of seizure medicine. It is important to carefully follow your medicine and  seizure-control plans during pregnancy. If you have a seizure during your pregnancy, your risk for early delivery may increase. Will I be able to breastfeed my baby? Women with epilepsy are encouraged to breastfeed. The antiepileptic drug may pass through your breast milk in small amounts, but the amount is usually not enough to affect your baby. Contact a health care provider if:  You think you are having seizures.  You do not feel the baby moving as much as usual.  You develop tiredness or weakness.  You feel like you are going to faint. Get help right away if:  You have very bad abdominal pain.  You have vaginal bleeding.  You do not feel the baby moving.  You have very bad headaches.  You have vision problems.  A part of your body feels numb.  You cannot stop vomiting. Summary  Epilepsy is a condition in which a person has repeated seizures over time.  Most women with epilepsy have normal pregnancies and healthy babies.  Follow your healthcare provider's recommendations regarding management and treatment of epilepsy during pregnancy. This information is not intended to replace advice given to you by your health care provider. Make sure you discuss any questions you have with your health care provider. Document Revised: 09/19/2018 Document Reviewed: 08/08/2016 Elsevier Patient Education  2020 [employer].

## 2020-02-23 LAB — LAMOTRIGINE LEVEL: Lamotrigine Lvl: 11.8 ug/mL (ref 2.0–20.0)

## 2020-02-24 LAB — LACOSAMIDE: Lacosamide: 6.7 ug/mL (ref 5.0–10.0)

## 2020-02-25 ENCOUNTER — Ambulatory Visit (INDEPENDENT_AMBULATORY_CARE_PROVIDER_SITE_OTHER): Payer: Self-pay | Admitting: Psychiatry

## 2020-02-25 ENCOUNTER — Other Ambulatory Visit: Payer: Self-pay

## 2020-02-25 DIAGNOSIS — F4310 Post-traumatic stress disorder, unspecified: Secondary | ICD-10-CM
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
## 2020-02-26 LAB — CULTURE, BLOOD (ROUTINE X 2)
Culture: NO GROWTH
Culture: NO GROWTH
Special Requests: ADEQUATE
Special Requests: ADEQUATE

## 2020-03-10 ENCOUNTER — Ambulatory Visit: Payer: Self-pay | Admitting: Psychiatry

## 2020-03-24 ENCOUNTER — Ambulatory Visit (INDEPENDENT_AMBULATORY_CARE_PROVIDER_SITE_OTHER): Payer: Self-pay | Admitting: Psychiatry

## 2020-03-24 ENCOUNTER — Other Ambulatory Visit: Payer: Self-pay

## 2020-03-24 DIAGNOSIS — F4310 Post-traumatic stress disorder, unspecified: Secondary | ICD-10-CM
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
## 2020-04-07 ENCOUNTER — Ambulatory Visit: Payer: Self-pay | Admitting: Psychiatry

## 2020-04-14 ENCOUNTER — Other Ambulatory Visit: Payer: Self-pay

## 2020-04-14 ENCOUNTER — Ambulatory Visit (INDEPENDENT_AMBULATORY_CARE_PROVIDER_SITE_OTHER): Payer: Self-pay | Admitting: Psychiatry

## 2020-04-14 DIAGNOSIS — F4310 Post-traumatic stress disorder, unspecified: Secondary | ICD-10-CM

## 2020-04-14 NOTE — Progress Notes (Signed)
°      [facility] Counselor/Therapist Progress Note  Patient ID: Amber Taylor, MRN: [id number],    Date: 04/14/2020  Time Spent: 49 minutes start time 11:11 AM end time 12 PM  Treatment Type: Individual Therapy  Reported Symptoms: anxiety, fatigue  Mental Status Exam:  Appearance:   Well Groomed     Behavior:  Appropriate  Motor:  Normal  Speech/Language:   Normal Rate  Affect:  Appropriate  Mood:  normal  Thought process:  normal  Thought content:    WNL  Sensory/Perceptual disturbances:    WNL  Orientation:  oriented to person, place, time/date and situation  Attention:  Good  Concentration:  Good  Memory:  WNL  Fund of knowledge:   Good  Insight:    Good  Judgment:   Good  Impulse Control:  Good   Risk Assessment: Danger to Self:  No Self-injurious Behavior: No Danger to Others: No Duty to Warn:no Physical Aggression / Violence:No  Access to Firearms a concern: No  Gang Involvement:No   Subjective: Patient was present for session.  She shared that she has followed plans from last session and she reported that they help.  Patient reports she continues to have a decrease in intrusive thoughts.  She went on to share that her husband is having some health issues.  She shared that her father has helped and through that they have been able to have some conversations with her father that have been positive. She has also been able to have some positive talks with her brother.  Patient stated that her children have responded positively to their grandfather interacting more with them and that has been very exciting for her.  Patient also reported that she is starting to feel the need to get more accomplished around the house and that has been positive.  Patient is concerned that she will not be able to work after much longer.  Patient stated she feels things are fairly stable with her seizure medication but knows the stress of everything could lead to more seizures and her physician  is encouraging her to stop work completely.  Patient was allowed time to process what that means for her and her concerns about finances and the loss of her career.  Patient was encouraged to remind herself she has to do what is best for her and her family and even though would be hard to give up her work she will gained so much.  Was encouraged to continue considering following her physician's recommendations especially after the birth of her third child.  Patient is to continue practicing her coping skills and trying to keep her stress level down as much as possible.  Interventions: Solution-Oriented/Positive Psychology  Diagnosis:   ICD-10-CM   1. PTSD (post-traumatic stress disorder)  F43.10     Plan: Patient is to use CBT and coping skills to decrease triggered responses.  Patient is to continue working on her relationship with her father.  Patient is to continue working on staying as relaxed as possible to keep stress down.  Patient is to consider stopping work to help deal with her health issues. Long-term goal: Develop and implement effective coping skills to carry out normal responsibilities and participate constructively in relationships Short-term goal: Practice implement relaxation training as a coping mechanism for tension panic stress anger and anxiety-decrease intrusive thoughts by 50%  Stevphen Meuse, [facility]

## 2020-05-10 ENCOUNTER — Ambulatory Visit (INDEPENDENT_AMBULATORY_CARE_PROVIDER_SITE_OTHER): Payer: Self-pay | Admitting: Psychiatry

## 2020-05-10 ENCOUNTER — Other Ambulatory Visit: Payer: Self-pay

## 2020-05-10 DIAGNOSIS — F4310 Post-traumatic stress disorder, unspecified: Secondary | ICD-10-CM

## 2020-05-10 NOTE — Progress Notes (Signed)
°      [facility] Counselor/Therapist Progress Note  Patient ID: Amber Taylor, MRN: [id number],    Date: 05/10/2020  Time Spent: 50 minutes start time 11:08 AM end time 11:58 AM  Treatment Type: Individual Therapy  Reported Symptoms: anxiety, triggered responses  Mental Status Exam:  Appearance:   Well Groomed     Behavior:  Appropriate  Motor:  Normal  Speech/Language:   Normal Rate  Affect:  Appropriate  Mood:  normal  Thought process:  normal  Thought content:    WNL  Sensory/Perceptual disturbances:    WNL  Orientation:  oriented to person, place, time/date and situation  Attention:  Good  Concentration:  Good  Memory:  WNL  Fund of knowledge:   Good  Insight:    Good  Judgment:   Good  Impulse Control:  Good   Risk Assessment: Danger to Self:  No Self-injurious Behavior: No Danger to Others: No Duty to Warn:no Physical Aggression / Violence:No  Access to Firearms a concern: No  Gang Involvement:No   Subjective: Patient was present for session.  She is continuing to work at this point.  She is hoping to make it until the baby is born.  She is doing well with her seizures but she is still having some pain issues.  Patient reported she is followed through with plans from last session and found them to be very helpful.  She shared that things are going well with her husband and her children and her father.  Patient reported that she has noticed her husband seems very pleased with her progress.  Discussed the importance of her continuing to do the things that have been discussed to take care of herself so that she can continue to of her progressing.  Patient shared situations with her sister-in-law that have been triggering.  Discussed ways to handle those situations.  Patient acknowledged her tendency to get triggered and want to jump into take care of things.  Encourage patient to take time to breathe and recognize what her body is telling her about different situations  and then make a decision based on that information.  Practiced that exercise in session patient was able to realize that she may want to try getting together with her aunt on Mother's Day but not at this time.  Patient was encouraged to continue just to recognize what comes up over the next few months and then once her baby is born trauma work can be continued.  Interventions: Solution-Oriented/Positive Psychology  Diagnosis:   ICD-10-CM   1. PTSD (post-traumatic stress disorder)  F43.10     Plan: Patient is to use CBT and coping skills to decrease triggered responses.  Patient is to continue working on plans from previous sessions to continue her progress.  Patient is to follow plan from session to try connect with her sister-in-law closer to Mother's Day.  Patient will continue trauma work once her baby is born Long-term goal: Develop and implement effective coping skills to carry out normal responsibilities and participate constructively in relationships Short-term goal: Practice implement relaxation training as a coping mechanism for tension panic stress anger and anxiety-decrease intrusive thoughts by 50%  Stevphen Meuse, [facility]

## 2020-05-17 NOTE — Patient Instructions (Signed)
Amber Taylor  05/17/2020   Your procedure is scheduled on:  05/30/2020  Arrive at 0530 at Entrance C on [address] at [facility]  and [employer]. You are invited to use the FREE valet parking or use the Visitor's parking deck.  Pick up the phone at the desk and dial [phone].  Call this number if you have problems the morning of surgery: [phone]  Remember:   Do not eat food:(After Midnight) Desps de medianoche.  Do not drink clear liquids: (After Midnight) Desps de medianoche.  Take these medicines the morning of surgery with A SIP OF WATER:  Take lamictal and VIMPAT as prescribed.     Do not wear jewelry, make-up or nail polish.  Do not wear lotions, powders, or perfumes. Do not wear deodorant.  Do not shave 48 hours prior to surgery.  Do not bring valuables to the hospital.  [facility] is not   responsible for any belongings or valuables brought to the hospital.  Contacts, dentures or bridgework may not be worn into surgery.  Leave suitcase in the car. After surgery it may be brought to your room.  For patients admitted to the hospital, checkout time is 11:00 AM the day of              discharge.      Please read over the following fact sheets that you were given:     Preparing for Surgery

## 2020-05-20 ENCOUNTER — Telehealth (HOSPITAL_COMMUNITY): Payer: Self-pay | Admitting: *Deleted

## 2020-05-20 NOTE — Telephone Encounter (Signed)
Preadmission screen  

## 2020-05-23 ENCOUNTER — Encounter (HOSPITAL_COMMUNITY): Payer: Self-pay

## 2020-05-23 NOTE — H&P (Signed)
Amber Taylor is a 89 [id number] female presenting for scheduled repeat cesarean section. Pt is dated per LMP which was confirmed with an 8week [place]. Pregnancy has been complicated by AMA, hx seizure disorder ( on lamictal - managed by her neurologist), low lying placenta that has since resolved, histroy of c/s and history of PTSD - stable. She is GBS negative. COVID 11/03/2019 Pt declines Tdap and Flu 03/08/2020 OB History    Gravida  4   Para  2   Term  2   Preterm      AB  1   Living  2     SAB      IAB      Ectopic  1   Multiple  0   Live Births  2          Past Medical History:  Diagnosis Date  . Allergy   . AMA (advanced maternal age) primigravida 35+   . Complication of anesthesia    neurologist concerned about general anesthesia if needed  . Ectopic pregnancy, tubal 06/05/2019  . Eczema   . Headache   . Placenta previa antepartum in third trimester 01/28/2018   PLACENTA PREVIA:  General counseling was then performed regarding placenta previa.  Ninety percent of placenta previa diagnosed in the early second trimester will resolve by term.  There is an association with IUGR therefore serial U/S every 4 weeks for placental location and fetal growth is indicated.  Patients have a 4-8% risk of recurrence in subsequent pregnancies.  . Polyhydramnios affecting pregnancy in third trimester 04/20/2016  . Seizures (HCC)   . Status post primary low transverse cesarean section 03/04/2018   Past Surgical History:  Procedure Laterality Date  . CESAREAN SECTION N/A 03/04/2018   Procedure: CESAREAN SECTION;  Surgeon: Edwinna Areola, DO;  Location: [facility];  Service: Obstetrics;  Laterality: N/A;  Heather, RNFA Seizure disorder, MFM recommendation of delivery at 37wks, previa  Spoke with Misty Stanley to add to this day  . CHOLECYSTECTOMY    . LAPAROSCOPIC UNILATERAL SALPINGECTOMY Left 06/05/2019   Procedure: LAPAROSCOPIC LEFT  SALPINGECTOMY WITH REMOVAL OF ECTOPIC  PREGNANCY;  Surgeon: Sherian Rein, MD;  Location: [facility] OR;  Service: Gynecology;  Laterality: Left;  . LAPAROSCOPY N/A 06/05/2019   Procedure: LAPAROSCOPY OPERATIVE;  Surgeon: Sherian Rein, MD;  Location: [facility] OR;  Service: Gynecology;  Laterality: N/A;  . WISDOM TOOTH EXTRACTION     Family History: family history includes Cancer in her maternal aunt, maternal grandfather, and mother; Heart disease in her paternal grandmother. Social History:  reports that she has never smoked. She has never used smokeless tobacco. She reports previous alcohol use. She reports that she does not use drugs.     Maternal Diabetes: No Genetic Screening: Declined Maternal Ultrasounds/Referrals: Normal Fetal Ultrasounds or other Referrals:  None Maternal Substance Abuse:  No Significant Maternal Medications:  Meds include: Other: lamictal Significant Maternal Lab Results:  Group B Strep negative Other Comments:  None  Review of Systems  Constitutional: Negative for activity change, appetite change, diaphoresis and fatigue.  Eyes: Negative for photophobia and visual disturbance.  Respiratory: Negative for chest tightness and shortness of breath.   Cardiovascular: Negative for chest pain, palpitations and leg swelling.  Gastrointestinal: Negative for abdominal pain.  Genitourinary: Negative for pelvic pain.  Musculoskeletal: Negative for back pain.  Neurological: Positive for seizures and light-headedness.  Psychiatric/Behavioral: The patient is nervous/anxious.    Maternal Medical History:  Reason for admission: Scheduled repeat cesarean section  Fetal activity: Perceived fetal activity is normal.    Prenatal complications: no prenatal complications Prenatal Complications - Diabetes: none.      Last menstrual period 04/19/2019, unknown if currently breastfeeding. Maternal Exam:  Uterine Assessment: Contraction strength is moderate.  Contraction frequency is regular.   Abdomen:  Patient reports no abdominal tenderness. Estimated fetal weight is AGA.   Fetal presentation: vertex  Introitus: Normal vulva. Normal vagina.  Pelvis: of concern for delivery.   Cervix: Cervix evaluated by digital exam.     Fetal Exam Fetal Monitor Review: Baseline rate: 184.  Variability: moderate (6-25 bpm).   Pattern: accelerations present and no decelerations.    Fetal State Assessment: Category I - tracings are normal.     Physical Exam Vitals and nursing note reviewed. Exam conducted with a chaperone present.  Constitutional:      Appearance: Normal appearance. She is normal weight.  Cardiovascular:     Pulses: Normal pulses.  Pulmonary:     Effort: Pulmonary effort is normal.  Genitourinary:    General: Normal vulva.  Musculoskeletal:        General: Normal range of motion.     Cervical back: Normal range of motion.  Skin:    General: Skin is warm.     Capillary Refill: Capillary refill takes 2 to 3 seconds.  Neurological:     General: No focal deficit present.     Mental Status: She is alert and oriented to person, place, and time. Mental status is at baseline.  Psychiatric:        Mood and Affect: Mood normal.        Thought Content: Thought content normal.        Judgment: Judgment normal.     Prenatal labs: ABO, Rh: --/--/AB NEG (09/12 1444) Antibody: NEG (09/12 1444) Rubella:   RPR:    HBsAg:    HIV:    GBS:     Assessment/Plan: 54OE [id number] female presenting for repeat cesarean section at 39 weeks - Admit - ERAS protocol - Hx seizure disorder ( neurologist Dr. Terance Hart) - SCDs - Review procedure and verify consent    [name] 05/23/2020, 1:09 PM

## 2020-05-26 ENCOUNTER — Ambulatory Visit: Payer: Self-pay | Admitting: Psychiatry

## 2020-05-26 NOTE — Patient Instructions (Signed)
Amber Taylor  05/26/2020   Your procedure is scheduled on:  05/30/2020  Arrive at 0530 at Entrance C on [address] at [facility]  and [employer]. You are invited to use the FREE valet parking or use the Visitor's parking deck.  Pick up the phone at the desk and dial [phone].  Call this number if you have problems the morning of surgery: [phone]  Remember:   Do not eat food:(After Midnight) Desps de medianoche.  Do not drink clear liquids: (After Midnight) Desps de medianoche.  Take these medicines the morning of surgery with A SIP OF WATER:  Take lamictal and vimpat as prescribed.  Please bring your Lamictal brandname medication with you.    Do not wear jewelry, make-up or nail polish.  Do not wear lotions, powders, or perfumes. Do not wear deodorant.  Do not shave 48 hours prior to surgery.  Do not bring valuables to the hospital.  [facility] is not   responsible for any belongings or valuables brought to the hospital.  Contacts, dentures or bridgework may not be worn into surgery.  Leave suitcase in the car. After surgery it may be brought to your room.  For patients admitted to the hospital, checkout time is 11:00 AM the day of              discharge.      Please read over the following fact sheets that you were given:     Preparing for Surgery

## 2020-05-27 ENCOUNTER — Encounter (HOSPITAL_COMMUNITY): Payer: Self-pay | Admitting: Obstetrics and Gynecology

## 2020-05-27 ENCOUNTER — Other Ambulatory Visit (HOSPITAL_COMMUNITY): Payer: Self-pay

## 2020-05-27 ENCOUNTER — Other Ambulatory Visit (HOSPITAL_COMMUNITY)
Admission: RE | Admit: 2020-05-27 | Discharge: 2020-05-27 | Disposition: A | Discharge status: Home / Self Care | Payer: Self-pay | Source: Ambulatory Visit | Attending: Obstetrics and Gynecology | Admitting: Obstetrics and Gynecology

## 2020-05-27 ENCOUNTER — Other Ambulatory Visit: Payer: Self-pay

## 2020-05-27 ENCOUNTER — Encounter (HOSPITAL_COMMUNITY)
Admission: RE | Admit: 2020-05-27 | Discharge: 2020-05-27 | Disposition: A | Discharge status: Home / Self Care | Payer: Self-pay | Source: Ambulatory Visit | Attending: Obstetrics and Gynecology | Admitting: Obstetrics and Gynecology

## 2020-05-27 DIAGNOSIS — Z01812 Encounter for preprocedural laboratory examination: Secondary | ICD-10-CM | POA: Insufficient documentation

## 2020-05-27 DIAGNOSIS — Z20822 Contact with and (suspected) exposure to covid-19: Secondary | ICD-10-CM | POA: Insufficient documentation

## 2020-05-27 LAB — CBC
HCT: 33.9 % — ABNORMAL LOW (ref 36.0–46.0)
Hemoglobin: 10.5 g/dL — ABNORMAL LOW (ref 12.0–15.0)
MCH: 28.8 pg (ref 26.0–34.0)
MCHC: 31 g/dL (ref 30.0–36.0)
MCV: 92.9 fL (ref 80.0–100.0)
Platelets: 233 10*3/uL (ref 150–400)
RBC: 3.65 MIL/uL — ABNORMAL LOW (ref 3.87–5.11)
RDW: 13.3 % (ref 11.5–15.5)
WBC: 9.6 10*3/uL (ref 4.0–10.5)
nRBC: 0 % (ref 0.0–0.2)

## 2020-05-27 LAB — TYPE AND SCREEN
ABO/RH(D): AB NEG
Antibody Screen: NEGATIVE

## 2020-05-27 LAB — SARS CORONAVIRUS 2 (TAT 6-24 HRS): SARS Coronavirus 2: NEGATIVE

## 2020-05-28 LAB — RPR: RPR Ser Ql: NONREACTIVE

## 2020-05-29 NOTE — Anesthesia Preprocedure Evaluation (Addendum)
Anesthesia Evaluation  Patient identified by MRN, date of birth, ID band Patient awake    Reviewed: Allergy & Precautions, NPO status , Patient's Chart, lab work & pertinent test results  History of Anesthesia Complications Negative for: history of anesthetic complications  Airway Mallampati: II  TM Distance: >3 FB Neck ROM: Full    Dental no notable dental hx. (+) Dental Advisory Given   Pulmonary neg pulmonary ROS,  06/05/2019 SARS CoV2 NEG   Pulmonary exam normal        Cardiovascular negative cardio ROS Normal cardiovascular exam     Neuro/Psych  Headaches, Seizures -, Well Controlled,  negative psych ROS   GI/Hepatic Neg liver ROS, Nausea with this ectopic   Endo/Other  negative endocrine ROS  Renal/GU negative Renal ROS     Musculoskeletal   Abdominal   Peds  Hematology  (+) Blood dyscrasia (Hb 11.6), anemia ,   Anesthesia Other Findings   Reproductive/Obstetrics (+) Pregnancy (ectopic)                            Anesthesia Physical  Anesthesia Plan  ASA: II  Anesthesia Plan: Spinal   Post-op Pain Management:    Induction:   PONV Risk Score and Plan: 2 and Ondansetron and Scopolamine patch - Pre-op  Airway Management Planned: Natural Airway and Simple Face Mask  Additional Equipment:   Intra-op Plan:   Post-operative Plan:   Informed Consent: I have reviewed the patients History and Physical, chart, labs and discussed the procedure including the risks, benefits and alternatives for the proposed anesthesia with the patient or authorized representative who has indicated his/her understanding and acceptance.     Dental advisory given  Plan Discussed with: CRNA and Anesthesiologist  Anesthesia Plan Comments:        Anesthesia Quick Evaluation

## 2020-05-30 ENCOUNTER — Inpatient Hospital Stay (HOSPITAL_COMMUNITY): Payer: Self-pay | Admitting: Anesthesiology

## 2020-05-30 ENCOUNTER — Encounter (HOSPITAL_COMMUNITY): Payer: Self-pay | Admitting: Obstetrics and Gynecology

## 2020-05-30 ENCOUNTER — Other Ambulatory Visit: Payer: Self-pay

## 2020-05-30 ENCOUNTER — Encounter (HOSPITAL_COMMUNITY)
Admission: RE | Disposition: A | Discharge status: Home / Self Care | Payer: Self-pay | Source: Home / Self Care | Attending: Obstetrics and Gynecology

## 2020-05-30 ENCOUNTER — Inpatient Hospital Stay (HOSPITAL_COMMUNITY)
Admission: RE | Admit: 2020-05-30 | Discharge: 2020-06-02 | DRG: 787 | Disposition: A | Discharge status: Home / Self Care | Payer: Self-pay | Attending: Obstetrics and Gynecology | Admitting: Obstetrics and Gynecology

## 2020-05-30 DIAGNOSIS — Z8616 Personal history of COVID-19: Secondary | ICD-10-CM

## 2020-05-30 DIAGNOSIS — Z3A39 39 weeks gestation of pregnancy: Secondary | ICD-10-CM

## 2020-05-30 DIAGNOSIS — O34211 Maternal care for low transverse scar from previous cesarean delivery: Principal | ICD-10-CM | POA: Diagnosis present

## 2020-05-30 DIAGNOSIS — G40909 Epilepsy, unspecified, not intractable, without status epilepticus: Secondary | ICD-10-CM | POA: Diagnosis present

## 2020-05-30 DIAGNOSIS — Z98891 History of uterine scar from previous surgery: Secondary | ICD-10-CM

## 2020-05-30 DIAGNOSIS — O99354 Diseases of the nervous system complicating childbirth: Secondary | ICD-10-CM | POA: Diagnosis present

## 2020-05-30 SURGERY — Surgical Case
Anesthesia: Spinal

## 2020-05-30 MED ORDER — LACOSAMIDE 200 MG PO TABS
200.0000 mg | ORAL_TABLET | Freq: Two times a day (BID) | ORAL | Status: DC
  Administered 2020-05-30 – 2020-06-02 (×6): 200 mg via ORAL
  Filled 2020-05-30 (×6): qty 1

## 2020-05-30 MED ORDER — GENTAMICIN SULFATE 40 MG/ML IJ SOLN
5.0000 mg/kg | INTRAVENOUS | Status: AC
  Administered 2020-05-30: 09:00:00 420 mg via INTRAVENOUS
  Filled 2020-05-30: qty 10.5

## 2020-05-30 MED ORDER — ONDANSETRON HCL 4 MG/2ML IJ SOLN
INTRAMUSCULAR | Status: DC | PRN
  Administered 2020-05-30: 4 mg via INTRAVENOUS

## 2020-05-30 MED ORDER — SCOPOLAMINE 1 MG/3DAYS TD PT72
MEDICATED_PATCH | TRANSDERMAL | Status: AC
  Filled 2020-05-30: qty 1

## 2020-05-30 MED ORDER — ACETAMINOPHEN 500 MG PO TABS
1000.0000 mg | ORAL_TABLET | Freq: Four times a day (QID) | ORAL | Status: DC
  Administered 2020-05-30 – 2020-06-02 (×11): 1000 mg via ORAL
  Filled 2020-05-30 (×13): qty 2

## 2020-05-30 MED ORDER — ERYTHROMYCIN 5 MG/GM OP OINT
TOPICAL_OINTMENT | OPHTHALMIC | Status: AC
  Filled 2020-05-30: qty 1

## 2020-05-30 MED ORDER — SODIUM CHLORIDE 0.9 % IR SOLN
Status: DC | PRN
  Administered 2020-05-30 (×2): 1000 mL

## 2020-05-30 MED ORDER — NALOXONE HCL 0.4 MG/ML IJ SOLN: 0.4000 mg | INTRAMUSCULAR | Status: DC | PRN

## 2020-05-30 MED ORDER — NALBUPHINE HCL 10 MG/ML IJ SOLN: 5.0000 mg | INTRAMUSCULAR | Status: DC | PRN

## 2020-05-30 MED ORDER — FENTANYL CITRATE (PF) 100 MCG/2ML IJ SOLN
INTRAMUSCULAR | Status: AC
  Filled 2020-05-30: qty 2

## 2020-05-30 MED ORDER — PRENATAL MULTIVITAMIN CH
1.0000 | ORAL_TABLET | Freq: Every day | ORAL | Status: DC
  Administered 2020-05-31 – 2020-06-02 (×3): 1 via ORAL
  Filled 2020-05-30 (×3): qty 1

## 2020-05-30 MED ORDER — BUPIVACAINE IN DEXTROSE 0.75-8.25 % IT SOLN
INTRATHECAL | Status: DC | PRN
  Administered 2020-05-30: 1.6 mL via INTRATHECAL

## 2020-05-30 MED ORDER — CLINDAMYCIN PHOSPHATE 900 MG/50ML IV SOLN
900.0000 mg | INTRAVENOUS | Status: AC
  Administered 2020-05-30: 08:00:00 900 mg via INTRAVENOUS

## 2020-05-30 MED ORDER — LAMOTRIGINE ER 200 MG PO TB24
600.0000 mg | ORAL_TABLET | Freq: Every day | ORAL | Status: DC
  Administered 2020-05-30 – 2020-06-01 (×3): 600 mg via ORAL
  Filled 2020-05-30 (×2): qty 3

## 2020-05-30 MED ORDER — ACETAMINOPHEN 500 MG PO TABS: 1000.0000 mg | ORAL_TABLET | ORAL | Status: DC

## 2020-05-30 MED ORDER — ONDANSETRON HCL 4 MG/2ML IJ SOLN
INTRAMUSCULAR | Status: AC
  Filled 2020-05-30: qty 2

## 2020-05-30 MED ORDER — LORAZEPAM 2 MG/ML IJ SOLN
0.5000 mg | INTRAMUSCULAR | Status: DC | PRN
  Administered 2020-05-30: 12:00:00 0.5 mg via INTRAVENOUS
  Filled 2020-05-30: qty 1

## 2020-05-30 MED ORDER — DIBUCAINE (PERIANAL) 1 % EX OINT: 1.0000 "application " | TOPICAL_OINTMENT | CUTANEOUS | Status: DC | PRN

## 2020-05-30 MED ORDER — MORPHINE SULFATE (PF) 0.5 MG/ML IJ SOLN
INTRAMUSCULAR | Status: DC | PRN
  Administered 2020-05-30: 150 ug via INTRATHECAL

## 2020-05-30 MED ORDER — SENNOSIDES-DOCUSATE SODIUM 8.6-50 MG PO TABS
2.0000 | ORAL_TABLET | Freq: Every day | ORAL | Status: DC
  Administered 2020-05-31 – 2020-06-02 (×3): 2 via ORAL
  Filled 2020-05-30 (×4): qty 2

## 2020-05-30 MED ORDER — PHENYLEPHRINE HCL-NACL 20-0.9 MG/250ML-% IV SOLN
INTRAVENOUS | Status: AC
  Filled 2020-05-30: qty 250

## 2020-05-30 MED ORDER — MORPHINE SULFATE (PF) 0.5 MG/ML IJ SOLN
INTRAMUSCULAR | Status: AC
  Filled 2020-05-30: qty 10

## 2020-05-30 MED ORDER — PHENYLEPHRINE HCL-NACL 20-0.9 MG/250ML-% IV SOLN
INTRAVENOUS | Status: DC | PRN
  Administered 2020-05-30: 60 ug/min via INTRAVENOUS
  Administered 2020-05-30: 80 ug/min via INTRAVENOUS

## 2020-05-30 MED ORDER — OXYCODONE HCL 5 MG PO TABS
5.0000 mg | ORAL_TABLET | ORAL | Status: DC | PRN
  Administered 2020-05-30 (×2): 5 mg via ORAL
  Administered 2020-05-30 – 2020-05-31 (×5): 10 mg via ORAL
  Administered 2020-06-01: 5 mg via ORAL
  Administered 2020-06-01: 10 mg via ORAL
  Administered 2020-06-02: 5 mg via ORAL
  Filled 2020-05-30: qty 1
  Filled 2020-05-30 (×2): qty 2
  Filled 2020-05-30: qty 1
  Filled 2020-05-30 (×2): qty 2
  Filled 2020-05-30: qty 1
  Filled 2020-05-30 (×2): qty 2
  Filled 2020-05-30: qty 1

## 2020-05-30 MED ORDER — SIMETHICONE 80 MG PO CHEW
80.0000 mg | CHEWABLE_TABLET | Freq: Three times a day (TID) | ORAL | Status: DC
  Administered 2020-05-30 – 2020-06-02 (×8): 80 mg via ORAL
  Filled 2020-05-30 (×8): qty 1

## 2020-05-30 MED ORDER — DIPHENHYDRAMINE HCL 50 MG/ML IJ SOLN: 12.5000 mg | INTRAMUSCULAR | Status: DC | PRN

## 2020-05-30 MED ORDER — TETANUS-DIPHTH-ACELL PERTUSSIS 5-2.5-18.5 LF-MCG/0.5 IM SUSY: 0.5000 mL | PREFILLED_SYRINGE | Freq: Once | INTRAMUSCULAR | Status: DC

## 2020-05-30 MED ORDER — LACTATED RINGERS IV SOLN: INTRAVENOUS | Status: DC

## 2020-05-30 MED ORDER — MEPERIDINE HCL 25 MG/ML IJ SOLN: 6.2500 mg | INTRAMUSCULAR | Status: DC | PRN

## 2020-05-30 MED ORDER — IBUPROFEN 800 MG PO TABS
800.0000 mg | ORAL_TABLET | Freq: Three times a day (TID) | ORAL | Status: AC
  Administered 2020-05-30 – 2020-06-02 (×9): 800 mg via ORAL
  Filled 2020-05-30 (×9): qty 1

## 2020-05-30 MED ORDER — DIPHENHYDRAMINE HCL 25 MG PO CAPS: 25.0000 mg | ORAL_CAPSULE | Freq: Four times a day (QID) | ORAL | Status: DC | PRN

## 2020-05-30 MED ORDER — PROMETHAZINE HCL 25 MG/ML IJ SOLN: 6.2500 mg | INTRAMUSCULAR | Status: DC | PRN

## 2020-05-30 MED ORDER — ONDANSETRON HCL 4 MG/2ML IJ SOLN
4.0000 mg | Freq: Three times a day (TID) | INTRAMUSCULAR | Status: DC | PRN
Start: 2020-05-30 — End: 2020-06-02

## 2020-05-30 MED ORDER — OXYTOCIN-SODIUM CHLORIDE 30-0.9 UT/500ML-% IV SOLN
INTRAVENOUS | Status: DC | PRN
  Administered 2020-05-30 (×2): 30 [IU] via INTRAVENOUS

## 2020-05-30 MED ORDER — WITCH HAZEL-GLYCERIN EX PADS: 1.0000 "application " | MEDICATED_PAD | CUTANEOUS | Status: DC | PRN

## 2020-05-30 MED ORDER — CLINDAMYCIN PHOSPHATE 900 MG/50ML IV SOLN
INTRAVENOUS | Status: AC
  Filled 2020-05-30: qty 50

## 2020-05-30 MED ORDER — OXYTOCIN-SODIUM CHLORIDE 30-0.9 UT/500ML-% IV SOLN
2.5000 [IU]/h | INTRAVENOUS | Status: AC
  Administered 2020-05-30: 11:00:00 2.5 [IU]/h via INTRAVENOUS

## 2020-05-30 MED ORDER — DEXTROSE 5 % IV SOLN
1.0000 ug/kg/h | INTRAVENOUS | Status: DC | PRN
Start: 2020-05-30 — End: 2020-06-02
  Filled 2020-05-30: qty 5

## 2020-05-30 MED ORDER — NALBUPHINE HCL 10 MG/ML IJ SOLN: 5.0000 mg | Freq: Once | INTRAMUSCULAR | Status: DC | PRN

## 2020-05-30 MED ORDER — FENTANYL CITRATE (PF) 100 MCG/2ML IJ SOLN
25.0000 ug | INTRAMUSCULAR | Status: DC | PRN
  Administered 2020-05-30: 50 ug via INTRAVENOUS

## 2020-05-30 MED ORDER — SOD CITRATE-CITRIC ACID 500-334 MG/5ML PO SOLN: 30.0000 mL | ORAL | Status: DC

## 2020-05-30 MED ORDER — DIPHENHYDRAMINE HCL 25 MG PO CAPS: 25.0000 mg | ORAL_CAPSULE | ORAL | Status: DC | PRN

## 2020-05-30 MED ORDER — LORAZEPAM 2 MG/ML IJ SOLN
0.0500 mg/kg | INTRAVENOUS | Status: DC | PRN
  Filled 2020-05-30: qty 2.1

## 2020-05-30 MED ORDER — LEVOMEFOLATE GLUCOSAMINE 400 MCG PO CAPS: 800.0000 ug | ORAL_CAPSULE | Freq: Every day | ORAL | Status: DC

## 2020-05-30 MED ORDER — SCOPOLAMINE 1 MG/3DAYS TD PT72
1.0000 | MEDICATED_PATCH | Freq: Once | TRANSDERMAL | Status: AC
  Administered 2020-05-30: 07:00:00 1.5 mg via TRANSDERMAL

## 2020-05-30 MED ORDER — OXYTOCIN-SODIUM CHLORIDE 30-0.9 UT/500ML-% IV SOLN
INTRAVENOUS | Status: AC
  Filled 2020-05-30: qty 500

## 2020-05-30 MED ORDER — SODIUM CHLORIDE 0.9% FLUSH: 3.0000 mL | INTRAVENOUS | Status: DC | PRN

## 2020-05-30 MED ORDER — MENTHOL 3 MG MT LOZG: 1.0000 | LOZENGE | OROMUCOSAL | Status: DC | PRN

## 2020-05-30 MED ORDER — FENTANYL CITRATE (PF) 100 MCG/2ML IJ SOLN
INTRAMUSCULAR | Status: DC | PRN
  Administered 2020-05-30: 15 ug via INTRATHECAL

## 2020-05-30 MED ORDER — SIMETHICONE 80 MG PO CHEW: 80.0000 mg | CHEWABLE_TABLET | ORAL | Status: DC | PRN

## 2020-05-30 MED ORDER — COCONUT OIL OIL: 1.0000 "application " | TOPICAL_OIL | Status: DC | PRN

## 2020-05-30 MED ORDER — NALBUPHINE HCL 10 MG/ML IJ SOLN
5.0000 mg | Freq: Once | INTRAMUSCULAR | Status: DC | PRN
Start: 2020-05-30 — End: 2020-06-02

## 2020-05-30 MED ORDER — ZOLPIDEM TARTRATE 5 MG PO TABS: 5.0000 mg | ORAL_TABLET | Freq: Every evening | ORAL | Status: DC | PRN

## 2020-05-30 MED ORDER — POVIDONE-IODINE 10 % EX SWAB
2.0000 "application " | Freq: Once | CUTANEOUS | Status: AC
  Administered 2020-05-30: 2 via TOPICAL

## 2020-05-30 SURGICAL SUPPLY — 36 items
BENZOIN TINCTURE PRP APPL 2/3 (GAUZE/BANDAGES/DRESSINGS) ×2 IMPLANT
CHLORAPREP W/TINT 26ML (MISCELLANEOUS) ×2 IMPLANT
CLAMP CORD UMBIL (MISCELLANEOUS) IMPLANT
CLOSURE STERI STRIP 1/2 X4 (GAUZE/BANDAGES/DRESSINGS) ×2 IMPLANT
CLOTH BEACON ORANGE TIMEOUT ST (SAFETY) ×2 IMPLANT
DRAPE C SECTION CLR SCREEN (DRAPES) ×2 IMPLANT
DRSG OPSITE POSTOP 4X10 (GAUZE/BANDAGES/DRESSINGS) ×2 IMPLANT
ELECT REM PT RETURN 9FT ADLT (ELECTROSURGICAL) ×2
ELECTRODE REM PT RTRN 9FT ADLT (ELECTROSURGICAL) ×1 IMPLANT
EXTRACTOR VACUUM KIWI (MISCELLANEOUS) IMPLANT
GLOVE BIO SURGEON STRL SZ 6.5 (GLOVE) ×2 IMPLANT
GLOVE BIOGEL PI IND STRL 7.0 (GLOVE) ×2 IMPLANT
GLOVE BIOGEL PI INDICATOR 7.0 (GLOVE) ×2
GOWN STRL REUS W/TWL LRG LVL3 (GOWN DISPOSABLE) ×4 IMPLANT
KIT ABG SYR 3ML LUER SLIP (SYRINGE) IMPLANT
NEEDLE HYPO 25X5/8 SAFETYGLIDE (NEEDLE) IMPLANT
NS IRRIG 1000ML POUR BTL (IV SOLUTION) ×2 IMPLANT
PACK C SECTION WH (CUSTOM PROCEDURE TRAY) ×2 IMPLANT
PAD OB MATERNITY 4.3X12.25 (PERSONAL CARE ITEMS) ×2 IMPLANT
RETRACTOR WND ALEXIS 25 LRG (MISCELLANEOUS) ×1 IMPLANT
RTRCTR C-SECT PINK 25CM LRG (MISCELLANEOUS) IMPLANT
RTRCTR WOUND ALEXIS 25CM LRG (MISCELLANEOUS) ×2
STRIP CLOSURE SKIN 1/2X4 (GAUZE/BANDAGES/DRESSINGS) IMPLANT
SUT CHROMIC 1 CTX 36 (SUTURE) ×4 IMPLANT
SUT PLAIN 0 NONE (SUTURE) IMPLANT
SUT PLAIN 2 0 XLH (SUTURE) ×2 IMPLANT
SUT VIC AB 0 CT1 27 (SUTURE) ×2
SUT VIC AB 0 CT1 27XBRD ANBCTR (SUTURE) ×2 IMPLANT
SUT VIC AB 2-0 CT1 27 (SUTURE) ×1
SUT VIC AB 2-0 CT1 TAPERPNT 27 (SUTURE) ×1 IMPLANT
SUT VIC AB 3-0 CT1 27 (SUTURE)
SUT VIC AB 3-0 CT1 TAPERPNT 27 (SUTURE) IMPLANT
SUT VIC AB 4-0 KS 27 (SUTURE) ×2 IMPLANT
TOWEL OR 17X24 6PK STRL BLUE (TOWEL DISPOSABLE) ×2 IMPLANT
TRAY FOLEY W/BAG SLVR 14FR LF (SET/KITS/TRAYS/PACK) ×2 IMPLANT
WATER STERILE IRR 1000ML POUR (IV SOLUTION) ×2 IMPLANT

## 2020-05-30 NOTE — Progress Notes (Addendum)
Patient ID: Amber Taylor, female   DOB: 1981-02-02, 39 y.o.   MRN: [id number] Pt pain not well controlled. Oxycodone helped earlier but having breakthrough pain now prior to when next due She has tolerated getting out of bed. Bonding well with baby. Has no other complaints  VSS GEN - NAD ABD - mild distension only, dressing c/d/i EXT - no homans   A/P: PPD#0 s/p repeat c/s, seizure disorder       - Pt may get tylenol plus oxycodone now then alternate with ibuprofen q 2-4hrs      - Reviewed medication once again; vimpat 200mg  po bid, lamictal 600mg  qhs; ativan 0.5mg  IV q 4hr prn aura

## 2020-05-30 NOTE — Op Note (Signed)
Operative Note    Preoperative Diagnosis 1. IUP at 39 weeks 2. Previous cesarean section 3. Seizure disorder   Postoperative Diagnosis: Same  Procedure: Repeat cesarean section   Surgeon: Britt Bottom DO   Anesthesia: Spinal   Fluids: LR 2.5L EBL: UOP:   Findings: Viable female infant in vertex position, Grossly normal uterus, tubes and ovaries. APGARS 9,9, Weight pending.    Specimen: Placenta to L.D   Procedure Note Patient was taken to the operating room where spinal anesthesia was administered. She was prepped and draped in the normal sterile fashion in the dorsal supine position with a leftward tilt. An appropriate time out was performed. An allis clamp test was performed and anesthesia was found to be adequate.  A Pfannenstiel skin incision was then made through the previous incision with the scalpel and carried through to the underlying layer of fascia by sharp dissection. The fascia was nicked in the midline and the incision was extended laterally with Mayo scissors. The superior aspect of the incision was grasped kocher clamps and dissected off the underlying rectus muscles. In a similar fashion the inferior aspect was dissected off the rectus muscles. Rectus muscles were separated in the midline and the peritoneal cavity entered bluntly. The peritoneal incision was then extended both superiorly and inferiorly with careful attention to avoid both bowel and bladder. The Alexis self-retaining wound retractor was then placed within the incision and the lower uterine segment exposed. The bladder flap was developed with Metzenbaum scissors and pushed away from the lower uterine segment. The lower uterine segment was then incised in a transverse fashion and the cavity itself entered bluntly. Clear amniotic fluid was noted.  The incision was extended bluntly. The infant's head was then lifted and delivered from the incision; bulb suction of mouth and nose were performed. The  remainder of the infant delivered easily and after a minute, the cord clamped and cut . The infant was handed off to the waiting nursery team. The placenta was then spontaneously expressed from the uterus and the uterus cleared of all clots and debris with moist lap sponge. The uterine incision was then repaired in a single layer with a  running locked layer 0 chromic suture. A second imbricated layer was placed using the same suture. The  ovaries were inspected and found to be grossly normal. The gutters were  cleared of all clots and debris. The uterine incision was inspected and found to be hemostatic. All instruments and sponges as well as the Alexis retractor were then removed from the abdomen. The rectus muscles and peritoneum were then reapproximated with 2-0 Vicryl. The fascia was then closed with 0 Vicryl in a running fashion. Subcutaneous tissue layer was thin so did not need to be reapproximated. The skin was closed with a subcuticular stitch of 4-0 Vicryl on a Keith needle and then reinforced with benzoin and Steri-Strips. At the conclusion of the procedure all instruments and sponge counts were correct. Patient was taken to the recovery room in good condition with her baby accompanying her skin to skin.

## 2020-05-30 NOTE — Anesthesia Postprocedure Evaluation (Signed)
Anesthesia Post Note  Patient: Amber Taylor  Procedure(s) Performed: CESAREAN SECTION (N/A )     Patient location during evaluation: PACU Anesthesia Type: Spinal Level of consciousness: awake and alert Pain management: pain level controlled Vital Signs Assessment: post-procedure vital signs reviewed and stable Respiratory status: spontaneous breathing and respiratory function stable Cardiovascular status: blood pressure returned to baseline and stable Postop Assessment: spinal receding Anesthetic complications: no   No complications documented.  Last Vitals:  Vitals:   05/30/20 1030 05/30/20 1045  BP:  (!) 94/56  Pulse: 66 66  Resp: 14 15  Temp: (!) 36.4 C 36.5 C  SpO2: 98% 97%    Last Pain:  Vitals:   05/30/20 1045  TempSrc: Oral  PainSc: 4    Pain Goal:    LLE Motor Response: Purposeful movement (05/30/20 1045)   RLE Motor Response: Purposeful movement (05/30/20 1045)       Epidural/Spinal Function Cutaneous sensation: Able to Discern Pressure (05/30/20 1045), Patient able to flex knees: Yes (05/30/20 1045), Patient able to lift hips off bed: No (05/30/20 1045), Back pain beyond tenderness at insertion site: No (05/30/20 1045), Progressively worsening motor and/or sensory loss: No (05/30/20 1045), Bowel and/or bladder incontinence post epidural: No (05/30/20 1045)  Amber Taylor DANIEL

## 2020-05-30 NOTE — Anesthesia Procedure Notes (Signed)
Spinal  Patient location during procedure: OR Start time: 05/30/2020 8:26 AM End time: 05/30/2020 8:29 AM Staffing Performed: other anesthesia staff  Anesthesiologist: Heather Roberts, MD Preanesthetic Checklist Completed: patient identified, IV checked, risks and benefits discussed, surgical consent, monitors and equipment checked, pre-op evaluation and timeout performed Spinal Block Patient position: sitting Prep: ChloraPrep and site prepped and draped Patient monitoring: continuous pulse ox, blood pressure and heart rate Approach: midline Location: L4-5 Injection technique: single-shot Needle Needle type: Pencan  Needle gauge: 24 G Needle length: 9 cm Assessment Sensory level: T4 Additional Notes Performed by J'Woin Alexia Freestone with guidance from Dr. Krista Blue. No complications noted

## 2020-05-30 NOTE — Interval H&P Note (Signed)
History and Physical Interval Note: Pt seen and evaluated. No change since H/P Consent confirmed for procedure: Repeat cesarean section  Confirmed dosing for lamictal postpartum per neurologist; also reviewed note regarding pt mgmt via 0.5mg  IV ativan prn aura and right after c/s per neurologist To OR when ready  05/30/2020 8:09 AM  Amber Taylor  has presented today for surgery, with the diagnosis of repeat c-section.  The various methods of treatment have been discussed with the patient and family. After consideration of risks, benefits and other options for treatment, the patient has consented to  Procedure(s) with comments: CESAREAN SECTION (N/A) - request RNFA as a surgical intervention.  The patient's history has been reviewed, patient examined, no change in status, stable for surgery.  I have reviewed the patient's chart and labs.  Questions were answered to the patient's satisfaction.     [name]

## 2020-05-30 NOTE — Transfer of Care (Signed)
Immediate Anesthesia Transfer of Care Note  Patient: Amber Taylor  Procedure(s) Performed: CESAREAN SECTION (N/A )  Patient Location: PACU  Anesthesia Type:Spinal  Level of Consciousness: awake, alert  and oriented  Airway & Oxygen Therapy: Patient Spontanous Breathing  Post-op Assessment: Report given to RN and Post -op Vital signs reviewed and stable  Post vital signs: Reviewed and stable  Last Vitals:  Vitals Value Taken Time  BP 117/56 05/30/20 0951  Temp    Pulse 77 05/30/20 0957  Resp 18 05/30/20 0957  SpO2 99 % 05/30/20 0957  Vitals shown include unvalidated device data.  Last Pain:  Vitals:   05/30/20 0635  TempSrc: Oral         Complications: No complications documented.

## 2020-05-31 LAB — CBC
HCT: 31.7 % — ABNORMAL LOW (ref 36.0–46.0)
Hemoglobin: 9.9 g/dL — ABNORMAL LOW (ref 12.0–15.0)
MCH: 28.9 pg (ref 26.0–34.0)
MCHC: 31.2 g/dL (ref 30.0–36.0)
MCV: 92.4 fL (ref 80.0–100.0)
Platelets: 209 10*3/uL (ref 150–400)
RBC: 3.43 MIL/uL — ABNORMAL LOW (ref 3.87–5.11)
RDW: 13.2 % (ref 11.5–15.5)
WBC: 9.6 10*3/uL (ref 4.0–10.5)
nRBC: 0 % (ref 0.0–0.2)

## 2020-05-31 NOTE — Progress Notes (Signed)
POSTPARTUM POSTOP PROGRESS NOTE  POD #1  Subjective:  Pain control improved since overnight..  Pt denies problems with ambulating, voiding or po intake.  She denies nausea or vomiting.  Pain is better controlled.  She has not had flatus. She has not had bowel movement.  Lochia Minimal. Desires circ for baby boy, >9lbs! Otherwise no complaints, denies aura or seizure symptoms at this time  Objective: Blood pressure 98/61, pulse 94, temperature 98.1 F (36.7 C), temperature source Oral, resp. rate 16, height 5\' 8"  (1.727 m), weight 83 kg, last menstrual period 04/19/2019, SpO2 98 %, unknown if currently breastfeeding.  Physical Exam:  General: alert, cooperative and no distress Lochia:normal flow Chest: CTAB Heart: RRR no m/r/g Abdomen: +BS, soft, nontender. Epigastric tympany, mild, DR palpated Uterine Fundus: firm, 2cm below umbilicus. Honeycomb dressing intact, minimal serosanguinous drainage Extremities: neg edema, neg calf TTP BL, neg Homans BL  Recent Labs    05/31/20 0625  HGB 9.9*  HCT 31.7*    Assessment/Plan:  ASSESSMENT: Amber Taylor is a 39 y.o. 24 s/p ERLTCS @ [redacted]w[redacted]d for h/o csx x1. PNC c/b AMA, h/o seizure d/o followed by neuro and managed well, resolved LLP.   Plan for discharge tomorrow, Breastfeeding and Circumcision prior to discharge  Seizure d/o: continue PO vimpat 200mg  BID plus lamictal 600mg  QHS, PRN IV ativan 0.5mg  q4hr for aura symptoms   LOS: 1 day

## 2020-05-31 NOTE — Social Work (Signed)
CSW received consult for hx of Anxiety and PTSD.  CSW met with MOB to offer support and complete assessment.     CSW introduced self and role. MOB was pleasant and engaged throughout assessment. MOB provided permission to speak with FOB present in the restroom. CSW informed MOB of the reason for consult. MOB expressed understanding. MOB reported she has history of PTSD with anxiety from childhood that is being therapeutically addressed. MOB reported she was diagnosed in February of 2021. MOB stated the pregnancy went very well and she did not experience any symptoms of PTSD or anxiety related to being pregnant. MOB stated she has never been on medications and attends therapy at [facility], which is helpful. Aside from FOB, MOB identifies her father as a support. MOB denies any current SI, HI or being involved in DV.  CSW provided education regarding the baby blues period vs. perinatal mood disorders, discussed treatment and gave resources for mental health follow up if concerns arise.  CSW recommends self-evaluation during the postpartum time period using the New Mom Checklist from Postpartum Progress and encouraged MOB to contact a medical professional if symptoms are noted at any time.   CSW provided review of Sudden Infant Death Syndrome (SIDS) precautions. MOB reported baby will sleep in a packn'play once discharged home. MOB identified PA Heahter Martin with [facility] for follow-up care and denies any transportation barriers. MOB denies any additional needs or resources at this time.   CSW identifies no further need for intervention and no barriers to discharge at this time.  Darra Lis, [place] [employer] [phone]

## 2020-06-01 NOTE — Lactation Note (Signed)
This note was copied from a baby's chart. Lactation Consultation Note  Patient Name: Boy Cyriah Childrey [id number] Date: 06/01/2020  [facility] consult received 12/22 at 10:47 am.   Baby boy [place] now 4 hours old.   Mom concerned because he has had a close to 9 percent weight loss. Mom had a csection.  Mom is an experienced breastfeeding mom.  Mom breastfed both of her last babies until they were 1 year.  Discussed moms medications.  Mom reports she was on them with her last baby and remembers what to look for.  Mom reports she felt like he was feeding well, but once he got circumcised he quit feeding as well.  Mom reports he came back very sleepy and doing more comfort suckling than eating.  Mom reports she started pumping and removed 15 ml with last pump session.  Explained to mom that was an awesome amount and to remember how small his tummy still is on the second day.  Infant started cuing and asked mom if she wanted to feed him while LC there.  Mom agreed.  Unswaddled him and gave to mom.  She undressed him and went to latch him.  Mom did not bring him in chin first or point nipple to roof of mouth and also his arm was between them.  Discussed that how many moms that nurse older babies forget how close they have to be in to breastfed well.     Assisted mom with repositioning him bringing him in chin first with mom supporting the breast and switched her from cradle to cross cradle.  Mouth open wide, lips flanged, tummy to mommy.  Mom reports more comfort.  Mom has abraded and reddened nipple tips.   Gave mom coconut oil to use with pumping and comfort gels.  Mom knows not to use the two together. Urged mom to compress/massage breast to help the flow.   Intermittent suckling and swallows noted.  Urged mom to post pump and feed back all expressed mothers milk to him.  Urged mom to pump close to every 3 hours past a breastfeed.  Urged mom to call lactation as needed.     Maternal Data    Feeding Feeding  Type: Breast Milk  LATCH Score                   Interventions    Lactation Tools Discussed/Used     Consult Status      Chace Bisch Michaelle Copas 06/01/2020, 12:35 PM

## 2020-06-01 NOTE — Progress Notes (Signed)
POD #2 LTCS Doing well, has more pain than with 1st c-section, low milk production so far Afeb, VSS Abd- soft, fundus firm, incision intact Will continue routine care, abd binder for splinting

## 2020-06-02 MED ORDER — OXYCODONE HCL 5 MG PO TABS: 5.0000 mg | ORAL_TABLET | ORAL | 0 refills | Status: AC | PRN

## 2020-06-02 MED ORDER — LORAZEPAM 0.5 MG PO TABS: 0.5000 mg | ORAL_TABLET | Freq: Three times a day (TID) | ORAL | 0 refills | Status: AC | PRN

## 2020-06-02 MED ORDER — IBUPROFEN 800 MG PO TABS: 800.0000 mg | ORAL_TABLET | Freq: Three times a day (TID) | ORAL | 0 refills | Status: AC

## 2020-06-02 MED ORDER — ACETAMINOPHEN 500 MG PO TABS: 1000.0000 mg | ORAL_TABLET | Freq: Four times a day (QID) | ORAL | 0 refills | Status: AC

## 2020-06-02 NOTE — Progress Notes (Signed)
Subjective: Postpartum Day 3: Cesarean Delivery Patient reports tolerating PO, + flatus and no problems voiding.   Pain is well-controlled now on po meds.  Breastfeeding going better.  Ready for d/c home  Objective: Vital signs in last 24 hours: Temp:  [97.7 F (36.5 C)-98.5 F (36.9 C)] 97.7 F (36.5 C) (12/23 0533) Pulse Rate:  [62-72] 62 (12/23 0533) Resp:  [16-18] 18 (12/23 0533) BP: (105-114)/(56-77) 114/56 (12/23 0533) SpO2:  [100 %] 100 % (12/22 2120)  Physical Exam:  General: alert and cooperative Lochia: appropriate Uterine Fundus: firm Incision: C/D/I small amount old drainage on bandage DVT Evaluation: No evidence of DVT seen on physical exam.  Recent Labs    05/31/20 0625  HGB 9.9*  HCT 31.7*    Assessment/Plan: Status post Cesarean section. Doing well postoperatively.  Discharge home with standard precautions and return to clinic in 2 weeks. Pt reports will check with neuro but doing well on pre-pregnancy doses of lamictal and vimpat, check blood levels in two weeks at incision check Will send in ativan for possible breakthrough aura/seizure  Oliver Pila 06/02/2020, 9:20 AM

## 2020-06-02 NOTE — Lactation Note (Signed)
This note was copied from a baby's chart. Lactation Consultation Note  Patient Name: Amber Taylor [id number] Date: 06/02/2020 Reason for consult: Follow-up assessment;Term;Infant weight loss;Other (Comment) (8 % weight loss)  Baby is 72 hours old ,bili WNL. Dyad for D/C today.  Per mom the baby cluster fed last night and was supplementing with donor milk and they ran out so formula was given.  Per mom breast are fuller and heavier today.  LC reassured mom that is a good sign,  Mom requested for the [facility] to check her sore nipples. LC noted positional strips ( intact) .  LC reviewed sore nipple and engorgement prevention and tx.  Mom has a hand pump and comfort gels.  LC provided the breast shells and a #27 F if needed for when the milk comes in.  Mom aware of the [facility] resources after D/C.    Maternal Data Has patient been taught Hand Expression?: Yes Does the patient have breastfeeding experience prior to this delivery?: Yes  Feeding Feeding Type:  (baby last fed at 7 30 am per mom) Nipple Type: Slow - flow  LATCH Score ( Latch Score by the [facility] )  Latch: Grasps breast easily, tongue down, lips flanged, rhythmical sucking.  Audible Swallowing: A few with stimulation  Type of Nipple: Everted at rest and after stimulation  Comfort (Breast/Nipple): Filling, red/small blisters or bruises, mild/mod discomfort  Hold (Positioning): No assistance needed to correctly position infant at breast.  LATCH Score: 8  Interventions Interventions: Breast feeding basics reviewed;Shells;Comfort gels;Hand pump  Lactation Tools Discussed/Used Tools: Shells;Pump;Flanges Flange Size: 24;27 Shell Type: Inverted Breast pump type: Manual WIC Program: No Pump Review: Milk Storage   Consult Status Consult Status: Complete Date: 06/02/20    Kathrin Greathouse 06/02/2020, 9:17 AM

## 2020-06-02 NOTE — Discharge Summary (Signed)
Postpartum Discharge Summary       Patient Name: Amber Taylor DOB: 02-Feb-1981 MRN: [id number]  Date of admission: 05/30/2020 Delivery date:05/30/2020  Delivering provider: [facility] Ocho[facility]  Date of discharge: 06/02/2020  Admitting diagnosis: Previous cesarean section [Z98.891] Intrauterine pregnancy: [redacted]w[redacted]d     Secondary diagnosis:  Active Problems:   Status post repeat low transverse cesarean section  Additional problems: AMA, Seizure disorder, prior c-section    Discharge diagnosis: Term Pregnancy Delivered                                              Post partum procedures:none Augmentation: N/A Complications: None  Hospital course: Sceduled C/S   39 y.o. yo [id number] at [redacted]w[redacted]d was admitted to the hospital 05/30/2020 for scheduled cesarean section with the following indication:Elective Repeat.Delivery details are as follows:  Membrane Rupture Time/Date: 8:57 AM ,05/30/2020   Delivery Method:C-Section, Low Transverse  Details of operation can be found in separate operative note.  Patient had an uncomplicated postpartum course.  She is ambulating, tolerating a regular diet, passing flatus, and urinating well. Patient is discharged home in stable condition on  06/02/20  She was d/c on her prepregnancy doses of seizure medications with ativan for any breakthrough aura or seizures.  She will have levels assessed in 2 weeks with her incision check.        Newborn Data: Birth date:05/30/2020  Birth time:8:58 AM  Gender:Female  Living status:Living  Apgars:9 ,9  Weight:4285 g     Magnesium Sulfate received: No BMZ received: No Rhophylac:No   Physical exam  Vitals:   06/01/20 0531 06/01/20 1350 06/01/20 2120 06/02/20 0533  BP: 103/64 107/75 105/77 (!) 114/56  Pulse: 69 72 64 62  Resp: 18 16 18 18   Temp: 97.7 F (36.5 C) 98.5 F (36.9 C)  97.7 F (36.5 C)  TempSrc: Oral Oral  Oral  SpO2:   100%   Weight:      Height:       General: alert and  cooperative Lochia: appropriate Uterine Fundus: firm Incision: C/D/I DVT Evaluation: No evidence of DVT seen on physical exam. Labs: Lab Results  Component Value Date   WBC 9.6 05/31/2020   HGB 9.9 (L) 05/31/2020   HCT 31.7 (L) 05/31/2020   MCV 92.4 05/31/2020   PLT 209 05/31/2020   CMP Latest Ref Rng & Units 02/22/2020  Glucose 70 - 99 mg/dL 98  BUN 6 - 20 mg/dL 6  Creatinine 02/24/2020 - 1.47 mg/dL 8.29  Sodium 5.62 - 130 mmol/L 140  Potassium 3.5 - 5.1 mmol/L 3.6  Chloride 98 - 111 mmol/L 109  CO2 22 - 32 mmol/L 24  Calcium 8.9 - 10.3 mg/dL 7.9(L)  Total Protein 6.5 - 8.1 g/dL -  Total Bilirubin 0.3 - 1.2 mg/dL -  Alkaline Phos 38 - 865 U/L -  AST 15 - 41 U/L -  ALT 0 - 44 U/L -   Edinburgh Score: Edinburgh Postnatal Depression Scale Screening Tool 05/31/2020  I have been able to laugh and see the funny side of things. 0  I have looked forward with enjoyment to things. 0  I have blamed myself unnecessarily when things went wrong. 1  I have been anxious or worried for no good reason. 0  I have felt scared or panicky for no good reason. 0  Things have  been getting on top of me. 1  I have been so unhappy that I have had difficulty sleeping. 0  I have felt sad or miserable. 0  I have been so unhappy that I have been crying. 1  The thought of harming myself has occurred to me. 0  Edinburgh Postnatal Depression Scale Total 3     After visit meds:  Allergies as of 06/02/2020      Reactions   Peanut-containing Drug Products    Feels ALL NUTS contraindicates medicine triggering seizures.   Prednisone    Other reaction(s): Confusion (intolerance) Other reaction(s): Confusion (intolerance)   Augmentin [amoxicillin-pot Clavulanate] Rash   Has patient had a PCN reaction causing immediate rash, facial/tongue/throat swelling, SOB or lightheadedness with hypotension: No Has patient had a PCN reaction causing severe rash involving mucus membranes or skin necrosis: No Has patient had  a PCN reaction that required hospitalization: No Has patient had a PCN reaction occurring within the last 10 years: Yes--RASH ON LEGS ONLY If all of the above answers are "NO", then may proceed with Cephalosporin use.   Penicillins Rash      Medication List    TAKE these medications   acetaminophen 500 MG tablet Commonly known as: TYLENOL Take 2 tablets (1,000 mg total) by mouth every 6 (six) hours.   ibuprofen 800 MG tablet Commonly known as: ADVIL Take 1 tablet (800 mg total) by mouth every 8 (eight) hours.   LamoTRIgine 200 MG Tb24 24 hour tablet Take 600 mg by mouth at bedtime. (PATIENT TO START POST DELIVERY)   LORazepam 0.5 MG tablet Commonly known as: Ativan Take 1 tablet (0.5 mg total) by mouth every 8 (eight) hours as needed for seizure.   METHYLFOLATE PO Take 800 mcg by mouth at bedtime.   oxyCODONE 5 MG immediate release tablet Commonly known as: Oxy IR/ROXICODONE Take 1-2 tablets (5-10 mg total) by mouth every 4 (four) hours as needed for moderate pain.   prenatal multivitamin Tabs tablet Take 1 tablet by mouth daily at 12 noon. What changed: when to take this   Vimpat 200 MG Tabs tablet Generic drug: lacosamide Take 200 mg by mouth 2 (two) times daily.        Discharge home in stable condition Infant Feeding: Bottle and Breast Infant Disposition:home with mother Discharge instruction: per After Visit Summary and Postpartum booklet. Activity: Advance as tolerated. Pelvic rest for 6 weeks.  Diet: routine diet Future Appointments: Future Appointments  Date Time Provider Department Center  06/28/2020 11:00 AM Stevphen Meuse, [facility] CP-CP None   Follow up Visit:  Follow-up Information    Edwinna Areola, DO. Schedule an appointment as soon as possible for a visit in 2 week(s).   Specialty: Obstetrics and Gynecology Why: incision check  Contact information: [address] [place] [address] [phone]                Please  schedule this patient for a In person postpartum visit in 2 weeks with the following provider: MD.  Delivery mode:  C-Section, Low Transverse  Anticipated Birth Control:  Unsure   06/02/2020 Oliver Pila, MD

## 2020-06-02 NOTE — Progress Notes (Signed)
AVS printed and discharged instructions given to patient. Patient instructed to call for follow-up appointment and to pick up prescriptions. All questions answered and patient verbalized understanding.  

## 2020-06-28 ENCOUNTER — Ambulatory Visit: Payer: Self-pay | Admitting: Psychiatry

## 2020-07-26 ENCOUNTER — Other Ambulatory Visit: Payer: Self-pay

## 2020-07-26 ENCOUNTER — Ambulatory Visit (INDEPENDENT_AMBULATORY_CARE_PROVIDER_SITE_OTHER): Payer: Self-pay | Admitting: Psychiatry

## 2020-07-26 DIAGNOSIS — F4310 Post-traumatic stress disorder, unspecified: Secondary | ICD-10-CM

## 2020-07-26 NOTE — Progress Notes (Signed)
      [facility] Counselor/Therapist Progress Note  Patient ID: Amber Taylor, MRN: [id number],    Date: 07/26/2020  Time Spent: 52 minutes start time 5:05 PM end time 5:57 PM  Treatment Type: Individual Therapy  Reported Symptoms: anxiety, triggered responses, health issues  Mental Status Exam:  Appearance:   Well Groomed     Behavior:  Appropriate  Motor:  Normal  Speech/Language:   Normal Rate  Affect:  Appropriate  Mood:  anxious  Thought process:  normal  Thought content:    WNL  Sensory/Perceptual disturbances:    WNL  Orientation:  oriented to person, place, time/date and situation  Attention:  Good  Concentration:  Good  Memory:  WNL  Fund of knowledge:   Good  Insight:    Good  Judgment:   Good  Impulse Control:  Good   Risk Assessment: Danger to Self:  No Self-injurious Behavior: No Danger to Others: No Duty to Warn:no Physical Aggression / Violence:No  Access to Firearms a concern: No  Gang Involvement:No   Subjective: Patient was present for session.  She shared that there are lots of issues with dynamics with her family.  She shared what is going on with her brother father and sister-in-law. She went on to share that her sister-in-law hurt her feelings, but she was able to work through the situation.  Patient explained the work that she is done in session has helped her recognize appropriate boundaries and stay more focused on the things that she can control fix and change.  Patient stated that she is hopeful her brother will learn to be able to do the same thing.  Patient discussed issues in her work situation.  Discussed the importance of her maintaining a healthy schedule and not doing more than she can to keep her anxiety at a level where she can function.  Agreed to talk with her boss about working from home as much as possible to help decrease stress.  Patient also has a Emergency planning/management officer going with them now to the homes for home visits which is helping her  feel safer and difficult situations.  Patient shared she got triggered by her daughter.  She explained that she was triggered by the situation with her children and her husband.  Discussed how to talk to him about the situation in a way that confronts it without her anxiety taking over.  Patient has had her baby and is still working on getting her seizure medication to the right levels.  Once the levels are where they need to be she will start working on trauma issues with EMDR again.  Interventions: Solution-Oriented/Positive Psychology  Diagnosis:   ICD-10-CM   1. PTSD (post-traumatic stress disorder)  F43.10     Plan: Patient is to use CBT and coping skills to decrease triggered responses.  Patient is to communicate with her boss concerning working from home to decrease anxiety.  Patient is to talk with her husband about the situation that triggered her.  Patient is to continue taking medication as directed. Long-term goal: Develop and implement effective coping skills to carry out normal responsibilities and participate constructively in relationships Short-term goal: Practice implement relaxation training as a coping mechanism for tension panic stress anger and anxiety-decrease intrusive thoughts by 50%  Stevphen Meuse, [facility]

## 2020-08-18 ENCOUNTER — Other Ambulatory Visit: Payer: Self-pay

## 2020-08-18 ENCOUNTER — Ambulatory Visit (INDEPENDENT_AMBULATORY_CARE_PROVIDER_SITE_OTHER): Payer: Self-pay | Admitting: Psychiatry

## 2020-08-18 DIAGNOSIS — F4310 Post-traumatic stress disorder, unspecified: Secondary | ICD-10-CM

## 2020-08-18 NOTE — Progress Notes (Signed)
[facility] Counselor/Therapist Progress Note  Patient ID: Amber Taylor, MRN: [id number],    Date: 08/18/2020  Time Spent: 52 minutes start time 3:08 PM and time 4 PM  Treatment Type: Individual Therapy  Reported Symptoms: anxiety, triggered responses, sadness, health issues  Mental Status Exam:  Appearance:   Casual and Neat     Behavior:  Appropriate  Motor:  Normal  Speech/Language:   Normal Rate  Affect:  Appropriate  Mood:  normal  Thought process:  normal  Thought content:    WNL  Sensory/Perceptual disturbances:    WNL  Orientation:  oriented to person, place, time/date and situation  Attention:  Good  Concentration:  Good  Memory:  WNL  Fund of knowledge:   Good  Insight:    Good  Judgment:   Good  Impulse Control:  Good   Risk Assessment: Danger to Self:  No Self-injurious Behavior: No Danger to Others: No Duty to Warn:no Physical Aggression / Violence:No  Access to Firearms a concern: No  Gang Involvement:No   Subjective: Patient was present for session. She shared that she had a bad experience with her massage therapy but she was okay. There are still lots of issues with her father and brother.  Patient is still trying to figure out how to best help the situation and not get triggered herself.  Patient stated she had felt things were moving in a more positive direction with her sister-in-law but realized that she has to be cautious in the situation.  patient is continuing to have issues with her seizures and trying to get her medication.  At this point due to the seizure issue EMDR would not be started again.  Also she had to bring her baby to session.  Patient discussed some of her work stresses.  Encouraged her to do what is going to be best and recognize that when she is stressed it does seem to create issues with her health so she needs to be aware of that concern.  Patient also discussed some of the concerns with her husband regarding communication and  their intimacy.  Encourage patient to make sure that her husband realizes that she is fearful about getting pregnant which is can impact their intimacy.  She shared she was firmer and letting him know that they have to use a condom and that has helped.  Encouraged patient to continue focusing on her self-care and recognizing the limits on her body as she is dealing with having 3 young children.  Patient is to consider whether or not she wants to meet jointly with her brother.  Interventions: Solution-Oriented/Positive Psychology  Diagnosis:   ICD-10-CM   1. PTSD (post-traumatic stress disorder)  F43.10     Plan: Patient is to use CBT and coping skills to decrease triggered responses.  Patient is to continue working on getting medication correct so that her seizures are under control.  Patient is to consider how many hours and what sort of situation she wants to continue to work in to try and manage stress appropriately.  Patient is to share her concerns about getting pregnant with her husband since it has taken such a toll on her body physically. Long-term goal: Develop and implement effective coping skills to carry out normal responsibilities and participate constructively in relationships Short-term goal: Practice implement relaxation training as a coping mechanism for tension panic stress anger and anxiety-decrease intrusive thoughts by 50%  Stevphen Meuse, [facility]

## 2020-09-06 ENCOUNTER — Other Ambulatory Visit: Payer: Self-pay

## 2020-09-06 ENCOUNTER — Ambulatory Visit (INDEPENDENT_AMBULATORY_CARE_PROVIDER_SITE_OTHER): Payer: Self-pay | Admitting: Psychiatry

## 2020-09-06 DIAGNOSIS — F4310 Post-traumatic stress disorder, unspecified: Secondary | ICD-10-CM
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
## 2020-09-20 ENCOUNTER — Ambulatory Visit (INDEPENDENT_AMBULATORY_CARE_PROVIDER_SITE_OTHER): Payer: Self-pay | Admitting: Psychiatry

## 2020-09-20 ENCOUNTER — Other Ambulatory Visit: Payer: Self-pay

## 2020-09-20 DIAGNOSIS — F4310 Post-traumatic stress disorder, unspecified: Secondary | ICD-10-CM

## 2020-09-20 NOTE — Progress Notes (Signed)
      [facility] Counselor/Therapist Progress Note  Patient ID: Amber Taylor, MRN: [id number],    Date: 09/20/2020  Time Spent: 52 minutes start time 10:06 AM end time 10:58 AM  Treatment Type: Individual Therapy  Reported Symptoms: sadness, triggered responses, crying spells, anxiety  Mental Status Exam:  Appearance:   Well Groomed     Behavior:  Appropriate  Motor:  Normal  Speech/Language:   Normal Rate  Affect:  Appropriate and Tearful  Mood:  sad  Thought process:  normal  Thought content:    WNL  Sensory/Perceptual disturbances:    WNL  Orientation:  oriented to person, place, time/date and situation  Attention:  Good  Concentration:  Good  Memory:  WNL  Fund of knowledge:   Good  Insight:    Good  Judgment:   Good  Impulse Control:  Good   Risk Assessment: Danger to Self:  No Self-injurious Behavior: No Danger to Others: No Duty to Warn:no Physical Aggression / Violence:No  Access to Firearms a concern: No  Gang Involvement:No   Subjective: Patient was present for session.  She shared it was the anniversary date of a friends death so it is a triggering day for her. She stated she has been texting the sister of her friend.She is still waiting to hear about her work situation. She explained that she and her husband are still having issues.  She stated that they are not communicating well.  Her husband has a hard time expressing emotions, so when it comes out it is overwhelming and hurtful.  Did EMDR set on husband saying "were not doing that", suds level 9, negative cognition "I am trapped" felt anxiety and sadness in her body.  Patient was able to reduce as level to 3.  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  Wrote out some ideas that were developed through her processing on an index card to give to her so she can share that with her husband.  Patient reported feeling positive at the end of session about  the potential for changing their communication issues.     Interventions: Solution-Oriented/Positive Psychology and Eye Movement Desensitization and Reprocessing (EMDR)  Diagnosis:   ICD-10-CM   1. PTSD (post-traumatic stress disorder)  F43.10     Plan: Patient is to use CBT and coping skills to decrease triggered responses.  Patient is to work on self-care and releasing stress appropriately.  Patient is to share what came out from session and was written down the index card with her husband concerning different ways for them to communicate appropriately. Long-term goal: Develop and implement effective coping skills to carry out normal responsibilities and participate constructively in relationships Short-term goal: Practice implement relaxation training as a coping mechanism for tension panic stress anger and anxiety-decrease intrusive thoughts by 50%  Stevphen Meuse, [facility]

## 2020-10-04 ENCOUNTER — Other Ambulatory Visit: Payer: Self-pay

## 2020-10-04 ENCOUNTER — Ambulatory Visit (INDEPENDENT_AMBULATORY_CARE_PROVIDER_SITE_OTHER): Payer: Self-pay | Admitting: Psychiatry

## 2020-10-04 DIAGNOSIS — F4310 Post-traumatic stress disorder, unspecified: Secondary | ICD-10-CM

## 2020-10-04 NOTE — Progress Notes (Signed)
[facility] Counselor/Therapist Progress Note  Patient ID: Amber Taylor, MRN: [id number],    Date: 10/04/2020  Time Spent: 51 minutes start time 10:09 AM end time 11 AM  Treatment Type: Individual Therapy  Reported Symptoms: anxiety, panic, not completing tasks, flashbacks, triggered responses  Mental Status Exam:  Appearance:   Well Groomed     Behavior:  Appropriate  Motor:  Normal  Speech/Language:   Normal Rate  Affect:  Appropriate  Mood:  normal  Thought process:  normal  Thought content:    WNL  Sensory/Perceptual disturbances:    WNL  Orientation:  oriented to person, place, time/date and situation  Attention:  Good  Concentration:  Good  Memory:  WNL  Fund of knowledge:   Good  Insight:    Good  Judgment:   Good  Impulse Control:  Good   Risk Assessment: Danger to Self:  No Self-injurious Behavior: No Danger to Others: No Duty to Warn:no Physical Aggression / Violence:No  Access to Firearms a concern: No  Gang Involvement:No   Subjective: Patient was present for session.  She shared that things are still stressful with her situation.  She stated that her Nanny got COVID and she has had to have someone fill in for her.  She stated she is having panic over the last 2 weeks. She is not sure what triggered things.Saturday there was a family event and that creates stress.She was able to recognize a few things that are surfacing recently.  Patient was able to recognize that she has issues with her husband, issues at work, issues with her brother, issues with her dad, and issues concerning nanny that are all surfacing at the same time.  Decided to do EMDR set on the [employer] issue concerning her husband's work, suds level 8, negative cognition "I cannot do it" felt anxiety in her shoulders.  Patient was able to reduce suds level to 4.  She was able to realize she and her nanny can switch off different strengths at different times and she does not have to feel  responsible for everything.  Patient reported feeling good about plan that she developed in session and agreed just to notice what surfaces because that is what needs to be addressed for next session as she goes through the week.  Patient also shared she is realizing she has an issue with cleaning because if things are perfect she gets very frustrated.  Discussed the fact that complete perfection in her house is unrealistic with 3 small kids and a husband.  Patient was encouraged to find one place that she can keep clean in her house so that when she needs to ground herself she can go to that 1 place and take some deep breaths.  Interventions: Solution-Oriented/Positive Psychology and Eye Movement Desensitization and Reprocessing (EMDR)  Diagnosis:   ICD-10-CM   1. PTSD (post-traumatic stress disorder)  F43.10     Plan: Patient is to use CBT and coping skills to decrease triggered responses.  Patient is to follow plan from session to talk with her nanny about different tasks that she can do to take the stress off of patient.  Is to continue communicating with her husband about needs and concerns.  Is to find one place in her home that she can keep as clean as she wants to to refer to and the moment she starts getting overwhelmed. Long-term goal: Develop and implement effective coping skills to carry out normal responsibilities and participate  constructively in relationships Short-term goal: Practice implement relaxation training as a coping mechanism for tension panic stress anger and anxiety-decrease intrusive thoughts by 50%  Amber Taylor, [facility]

## 2020-10-17 ENCOUNTER — Other Ambulatory Visit: Payer: Self-pay

## 2020-10-17 ENCOUNTER — Ambulatory Visit (INDEPENDENT_AMBULATORY_CARE_PROVIDER_SITE_OTHER): Payer: Self-pay | Admitting: Psychiatry

## 2020-10-17 DIAGNOSIS — F4310 Post-traumatic stress disorder, unspecified: Secondary | ICD-10-CM

## 2020-10-17 NOTE — Progress Notes (Signed)
      [facility] Counselor/Therapist Progress Note  Patient ID: Amber Taylor, MRN: [id number],    Date: 10/17/2020  Time Spent: 52 minutes start time 3:10 PM end time 4:02 PM  Treatment Type: Individual Therapy  Reported Symptoms: anxiety, triggered responses, frustration  Mental Status Exam:  Appearance:   Well Groomed     Behavior:  Appropriate  Motor:  Normal  Speech/Language:   Normal Rate  Affect:  Appropriate  Mood:  normal  Thought process:  normal  Thought content:    WNL  Sensory/Perceptual disturbances:    WNL  Orientation:  oriented to person, place, time/date and situation  Attention:  Good  Concentration:  Good  Memory:  WNL  Fund of knowledge:   Good  Insight:    Good  Judgment:   Good  Impulse Control:  Good   Risk Assessment: Danger to Self:  No Self-injurious Behavior: No Danger to Others: No Duty to Warn:no Physical Aggression / Violence:No  Access to Firearms a concern: No  Gang Involvement:No   Subjective: Patient was present for session.  She reported she and her husband are still having some communication issues.  She shared through the communication she realized that she needs structure and right now it is not there for her.  She explained that her husband told her she assumes everything is her fault and it isn't.  Patient did some exploring as to when she feels she started having that feeling or thought.  She was able to identify being a child and having been at her grandmother's who was having her take care of her brother and then coming home and her mother told her not to do with that way.  Patient did EMDR set on her mother saying not to use the powder for her brother's feet, suds level 9, negative cognition "I am powerless" felt anxiety in her neck and shoulders.  Patient was able to reduce suds level to 3.  She was able to recognize that the issue was really her mother's and not hers and that she does not have to have control because her brother  is okay and can take care of himself.  She was also able to realize that a lot of her control issues deal with the fact that because of her seizures she cannot drive or do a lot of the things that she would like to do.  Encourage patient to focus on the things that she can control fix and change and to take things 1 step at a time.  Was able to realize some things that she would want to share with her husband about what was processed in session.  Interventions: Solution-Oriented/Positive Psychology and Eye Movement Desensitization and Reprocessing (EMDR)  Diagnosis:   ICD-10-CM   1. PTSD (post-traumatic stress disorder)  F43.10     Plan: Patient is to use CBT and coping skills to decrease triggered responses.  Patient is to continue working on finding ways to put structure into her daily life and routine.  Patient is to try and focus on the things that she can control fix and change. Long-term goal: Develop and implement effective coping skills to carry out normal responsibilities and participate constructively in relationships Short-term goal: Practice implement relaxation training as a coping mechanism for tension panic stress anger and anxiety-decrease intrusive thoughts by 50%  Stevphen Meuse, [facility]

## 2020-10-20 ENCOUNTER — Ambulatory Visit: Payer: Self-pay | Admitting: Psychiatry

## 2020-11-03 ENCOUNTER — Ambulatory Visit: Payer: Self-pay | Admitting: Psychiatry

## 2020-11-17 ENCOUNTER — Ambulatory Visit (INDEPENDENT_AMBULATORY_CARE_PROVIDER_SITE_OTHER): Payer: Self-pay | Admitting: Psychiatry

## 2020-11-17 ENCOUNTER — Telehealth: Payer: Self-pay | Admitting: Psychiatry

## 2020-11-17 DIAGNOSIS — F4310 Post-traumatic stress disorder, unspecified: Secondary | ICD-10-CM

## 2020-11-17 NOTE — Progress Notes (Signed)
[facility] Counselor/Therapist Progress Note  Patient ID: Amber Taylor, MRN: [id number],    Date: 11/17/2020  Time Spent: 52 minutes start time 10:03 AM end time 10:55 AM Virtual Visit via Telephone Note Connected with patient by a video enabled telemedicine/telehealth application, with their informed consent, and verified patient privacy and that I am speaking with the correct person using two identifiers. I discussed the limitations, risks, security and privacy concerns of performing psychotherapy and management service by telephone and the availability of in person appointments. I also discussed with the patient that there may be a patient responsible charge related to this service. The patient expressed understanding and agreed to proceed. I discussed the treatment planning with the patient. The patient was provided an opportunity to ask questions and all were answered. The patient agreed with the plan and demonstrated an understanding of the instructions. The patient was advised to call  our office if  symptoms worsen or feel they are in a crisis state and need immediate contact.   Therapist Location: office Patient Location: home    Treatment Type: Individual Therapy  Reported Symptoms: anxiety,sleep issues, triggered responses  Mental Status Exam:  Appearance:   Casual     Behavior:  Appropriate  Motor:  Normal  Speech/Language:   Normal Rate  Affect:  Appropriate  Mood:  anxious  Thought process:  normal  Thought content:    WNL  Sensory/Perceptual disturbances:    WNL  Orientation:  oriented to person, place, time/date, and situation  Attention:  Good  Concentration:  Good  Memory:  WNL  Fund of knowledge:   Good  Insight:    Good  Judgment:   Good  Impulse Control:  Good   Risk Assessment: Danger to Self:  No Self-injurious Behavior: No Danger to Others: No Duty to Warn:no Physical Aggression / Violence:No  Access to Firearms a concern: No  Gang  Involvement:No   Subjective: Met with patient via virtual session.  She shared that  she could not get a ride to treatment.  Patient explained that things are very stressful due to her loosing her job at the end of June and things changing with her husband's business. She is trying to just support him.  She shared her nanny has been sharing things about her husband's behaviors that are concerning.  Dynamics with her husband's family is hard. Patient stated her father upset her greatly. She shared that her father is allowing someone like her mother manipulate situations and that is triggering for her.  Discussed different ways that she can communicate her concerns with her father appropriately.  Patient explained there is a stressful situation at her new job that is hard to handle but it does not seem to be anything she can do at this time.  Patient was encouraged to recognize there are a lot of things in her world right now that she really does not have control over and cannot do anything to change.  The importance of having a visual of handing it over or letting it go in those situations was discussed with patient.  She was encouraged to try and continue focusing on the things that she can control fix and change and visually let the other stuff go from her brain.  Patient agreed to work on that exercise over the next few weeks.  Interventions: Solution-Oriented/Positive Psychology,CBT  Diagnosis:   ICD-10-CM   1. PTSD (post-traumatic stress disorder)  F43.10  Plan: Patient is to use CBT and coping skills to decrease triggered responses.  Patient is to talk to her father about the pattern she is seeing that is triggering for her.  Patient is to practice focusing on the things that she can control fix and change and letting go of the things she cannot.  Patient is to continue working with provider on other medical issues. Long-term goal: Develop and implement effective coping skills to carry out  normal responsibilities and participate constructively in relationships Short-term goal: Practice implement relaxation training as a coping mechanism for tension panic stress anger and anxiety-decrease intrusive thoughts by 50%  Lina Sayre, [facility]

## 2020-11-17 NOTE — Telephone Encounter (Signed)
Ms. xochitl, egle are scheduled for a virtual visit with your provider today.    Just as we do with appointments in the office, we must obtain your consent to participate.  Your consent will be active for this visit and any virtual visit you may have with one of our providers in the next 365 days.    If you have a MyChart account, I can also send a copy of this consent to you electronically.  All virtual visits are billed to your insurance company just like a traditional visit in the office.  As this is a virtual visit, video technology does not allow for your provider to perform a traditional examination.  This may limit your provider's ability to fully assess your condition.  If your provider identifies any concerns that need to be evaluated in person or the need to arrange testing such as labs, EKG, etc, we will make arrangements to do so.    Although advances in technology are sophisticated, we cannot ensure that it will always work on either your end or our end.  If the connection with a video visit is poor, we may have to switch to a telephone visit.  With either a video or telephone visit, we are not always able to ensure that we have a secure connection.   I need to obtain your verbal consent now.   Are you willing to proceed with your visit today?   SULEIKA DONAVAN has provided verbal consent on 11/17/2020 for a virtual visit (video or telephone).   Stevphen Meuse, [facility] 11/17/2020  11:13 AM

## 2020-12-01 ENCOUNTER — Ambulatory Visit: Payer: Self-pay | Admitting: Psychiatry

## 2020-12-15 ENCOUNTER — Ambulatory Visit (INDEPENDENT_AMBULATORY_CARE_PROVIDER_SITE_OTHER): Payer: Self-pay | Admitting: Psychiatry

## 2020-12-15 DIAGNOSIS — F4310 Post-traumatic stress disorder, unspecified: Secondary | ICD-10-CM

## 2020-12-15 NOTE — Progress Notes (Signed)
[facility] Counselor/Therapist Progress Note  Patient ID: Amber Taylor, MRN: [id number],    Date: 12/15/2020  Time Spent: 52 minutes start time 10:06 AM end time 10:58 AM Virtual Visit via Telephone Note Connected with patient by a video enabled telemedicine/telehealth application, with their informed consent, and verified patient privacy and that I am speaking with the correct person using two identifiers. I discussed the limitations, risks, security and privacy concerns of performing psychotherapy and management service by telephone and the availability of in person appointments. I also discussed with the patient that there may be a patient responsible charge related to this service. The patient expressed understanding and agreed to proceed. I discussed the treatment planning with the patient. The patient was provided an opportunity to ask questions and all were answered. The patient agreed with the plan and demonstrated an understanding of the instructions. The patient was advised to call  our office if  symptoms worsen or feel they are in a crisis state and need immediate contact.   Therapist Location: office Patient Location: home    Treatment Type: Individual Therapy  Reported Symptoms: anxiety, triggered responses, anger, sadness  Mental Status Exam:  Appearance:   Casual     Behavior:  Appropriate  Motor:  Normal  Speech/Language:   Normal Rate  Affect:  Appropriate  Mood:  normal  Thought process:  normal  Thought content:    WNL  Sensory/Perceptual disturbances:    WNL  Orientation:  oriented to person, place, time/date, and situation  Attention:  Good  Concentration:  Good  Memory:  WNL  Fund of knowledge:   Good  Insight:    Good  Judgment:   Good  Impulse Control:  Good   Risk Assessment: Danger to Self:  No Self-injurious Behavior: No Danger to Others: No Duty to Warn:no Physical Aggression / Violence:No  Access to Firearms a concern: No  Gang  Involvement:No   Subjective: Met with patient via virtual session.  She shared that she she was not feeling well.  She went on to share that a friend of the family came to stay with her and that has been good due to her understanding the dynamics in the family without judgement.  So she is able to talk to her about things she went through with her mother. She is still not sure what she will be doing for work. She and her husband have had some major conflicts but it seems to have ended up helping the dynamics in the marriage. She shared that she was able to set a boundary with him.  But that there are still multiple things that she wants to address.  Gave patient the opportunity to share and processed through the different issues she is currently having with her husband.  Discussed difference ways of looking at the situation and different coping skills and ways to handle it.  Patient was able to develop plans that she felt were be positive for her.  The importance of her continuing to work on her own self-care during this time was discussed with patient.  She shared that she may be looking for a house because her father agreed to rent 1 form and that would be very positive but very stressful.  Patient was encouraged to allow her father to do that because that would give him a way to help her even when things are very difficult and she cannot necessarily have him fix his situation for her.  Interventions:  Cognitive Behavioral Therapy, Solution-Oriented/Positive Psychology, and Insight-Oriented  Diagnosis:   ICD-10-CM   1. PTSD (post-traumatic stress disorder)  F43.10       Plan: Patient is to use CBT and coping skills to decrease triggered responses.  Patient is to follow plans from session to communicate needs with her husband appropriately.  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  Patient is to work with her father on finding a new home. Long-term goal: Develop and implement  effective coping skills to carry out normal responsibilities and participate constructively in relationships Short-term goal: Practice implement relaxation training as a coping mechanism for tension panic stress anger and anxiety-decrease intrusive thoughts by 50%   Lina Sayre, [facility]

## 2021-04-05 ENCOUNTER — Ambulatory Visit (INDEPENDENT_AMBULATORY_CARE_PROVIDER_SITE_OTHER): Payer: Self-pay | Admitting: Psychiatry

## 2021-04-05 ENCOUNTER — Other Ambulatory Visit: Payer: Self-pay

## 2021-04-05 DIAGNOSIS — F4310 Post-traumatic stress disorder, unspecified: Secondary | ICD-10-CM

## 2021-04-05 NOTE — Progress Notes (Signed)
      [facility] Counselor/Therapist Progress Note  Patient ID: Amber Taylor, MRN: [id number],    Date: 04/05/2021  Time Spent: 52 minutes start time 11:01 AM end time 11:53 AM  Treatment Type: Individual Therapy  Reported Symptoms: anxiety, triggered responses, sadness, intrusive thoughts  Mental Status Exam:  Appearance:   Casual and Neat     Behavior:  Appropriate  Motor:  Normal  Speech/Language:   Normal Rate  Affect:  Appropriate  Mood:  anxious  Thought process:  normal  Thought content:    WNL  Sensory/Perceptual disturbances:    WNL  Orientation:  oriented to person, place, time/date, and situation  Attention:  Good  Concentration:  Good  Memory:  WNL  Fund of knowledge:   Good  Insight:    Good  Judgment:   Good  Impulse Control:  Good   Risk Assessment: Danger to Self:  No Self-injurious Behavior: No Danger to Others: No Duty to Warn:no Physical Aggression / Violence:No  Access to Firearms a concern: No  Gang Involvement:No   Subjective: Patient was present for session.  She shared that there have been weeks of sicknesses.  She is currently figure out how to get her husband help at this time who is having a possible gallbladder attack.  She continues to have transitions with her work that is impacting time with her children.  She recognizes she is going to have to be more structured.  Patient stated that the biggest issue she is currently having is her relationship with her husband due to communication issues.  She shared she gets very upset when she triggers her husband, suds level 9, negative cognition "I cannot trust myself" felt fear frustration and anxiety in her chest patient was able to reduce" to 3.  She was able to recognize that there are multiple things on her life that she feels are very out of control and all the anxiety is causing the issues within her relationship.  Different ways to manage things appropriately were discussed in session and plans  were developed.  Interventions: Solution-Oriented/Positive Psychology, Insight-Oriented, and BS P  Diagnosis:   ICD-10-CM   1. PTSD (post-traumatic stress disorder)  F43.10       Plan: Patient is to use CBT and coping skills to decrease triggered responses.  Patient is to follow plans from session to communicate differently with her husband.  Patient is to continue working on releasing negative emotions appropriately.  Patient is to take medication as directed. Long-term goal: Develop and implement effective coping skills to carry out normal responsibilities and participate constructively in relationships Short-term goal: Practice implement relaxation training as a coping mechanism for tension panic stress anger and anxiety-decrease intrusive thoughts by 50%  Stevphen Meuse, [facility]

## 2021-04-19 ENCOUNTER — Ambulatory Visit: Payer: Self-pay | Admitting: Psychiatry

## 2021-05-18 IMAGING — MR MR HEAD W/O CM
12 of 13 series · 43 of 48 positions shown · non-contrast
Comparison: Prior CT from 01/07/2019.

CLINICAL DATA: Initial evaluation for acute mental status change.

EXAM:
MRI HEAD WITHOUT CONTRAST
TECHNIQUE: Multiplanar, multiecho pulse sequences of the brain and surrounding
structures were obtained without intravenous contrast.

[Series 5: DWI · axial · 3.0mm · 0.88mm/px · z∈[-119,+22]mm · 6 of 96 slices shown (1 of 4)]
[im 1/96]
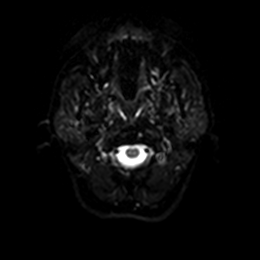
[im 20/96]
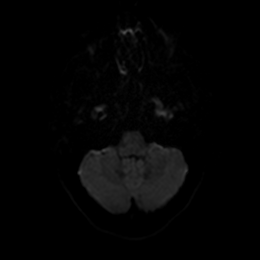
[im 39/96]
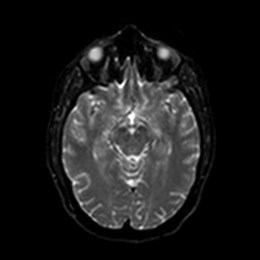
[im 58/96]
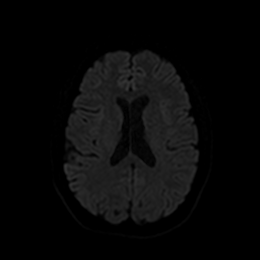
[im 77/96]
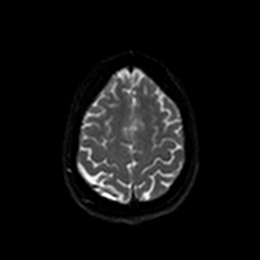
[im 96/96]
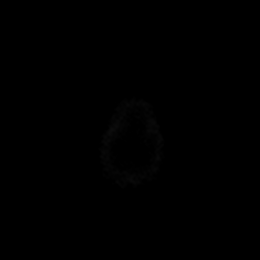

[Series 6: DWI · axial · 3.0mm · 0.88mm/px · z∈[-119,+22]mm · 3 of 48 slices shown (2 of 4)]
[im 1/48]
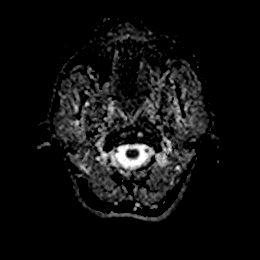
[im 24/48]
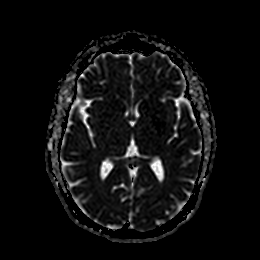
[im 48/48]
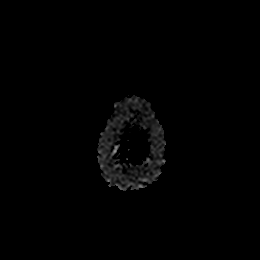

[Series 7: DWI · coronal · 4.0mm · 0.88mm/px · 5 of 68 slices shown (3 of 4)]
[im 1/68]
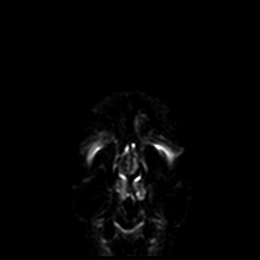
[im 17/68]
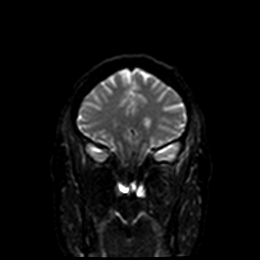
[im 34/68]
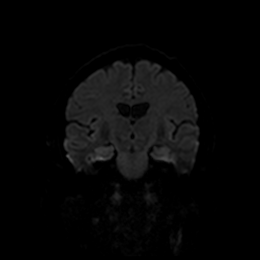
[im 51/68]
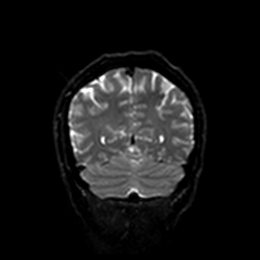
[im 68/68]
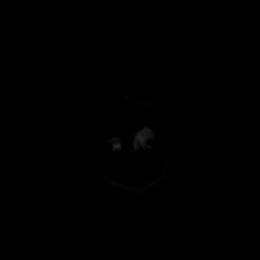

[Series 8: DWI · coronal · 4.0mm · 0.88mm/px · 3 of 34 slices shown (4 of 4)]
[im 1/34]
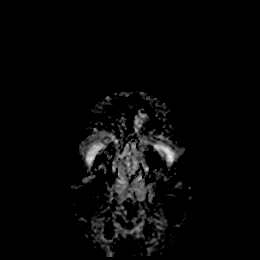
[im 17/34]
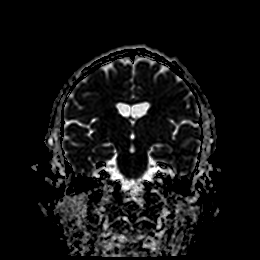
[im 34/34]
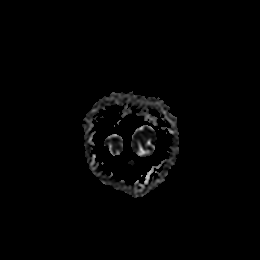

[Series 9: T1 · sagittal · 5.0mm · 0.75mm/px · 2 of 23 slices shown]
[im 1/23]
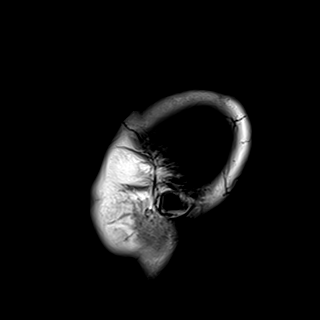
[im 23/23]
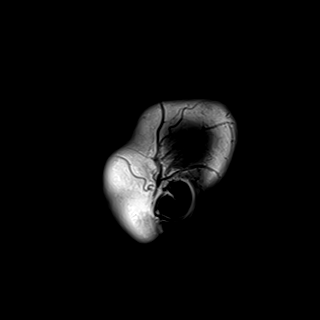

[Series 10: T2 · axial · 5.0mm · 0.72mm/px · z∈[-123,+21]mm · 2 of 25 slices shown (1 of 2)]
[im 1/25]
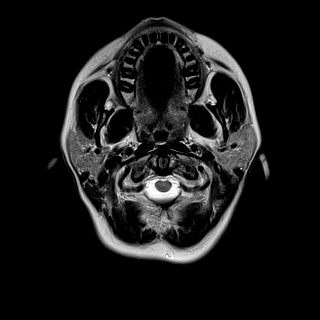
[im 25/25]
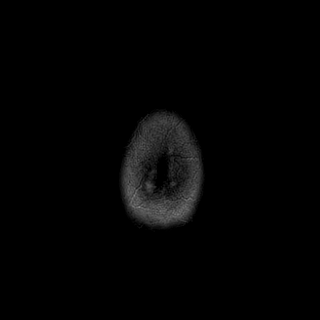

[Series 11: FLAIR · axial · 5.0mm · 0.45mm/px · z∈[-122,+22]mm · 2 of 25 slices shown]
[im 1/25]
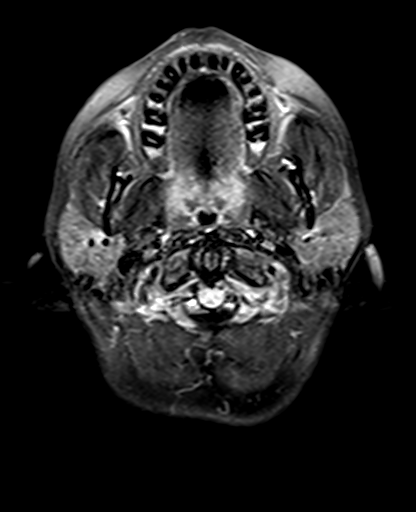
[im 25/25]
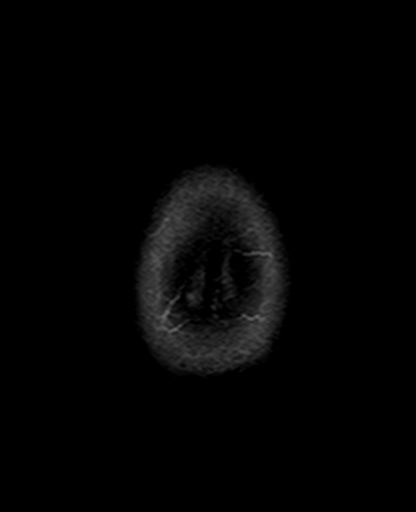

[Series 12: mag_images · axial · 3.0mm · 0.90mm/px · z∈[-134,+42]mm · 5 of 60 slices shown]
[im 1/60]
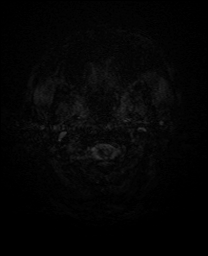
[im 15/60]
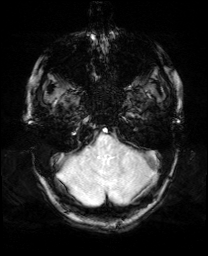
[im 30/60]
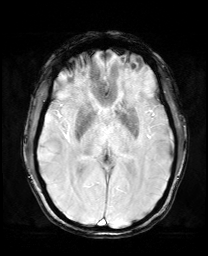
[im 45/60]
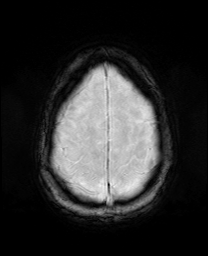
[im 60/60]
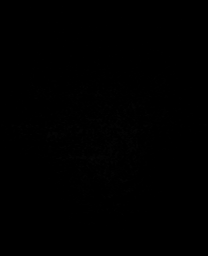

[Series 13: pha_images · axial · 3.0mm · 0.90mm/px · z∈[-134,+30]mm · 4 of 56 slices shown]
[im 1/56]
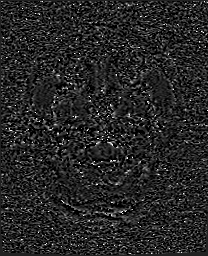
[im 19/56]
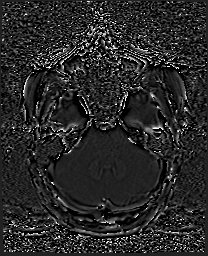
[im 37/56]
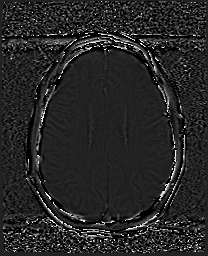
[im 56/56]
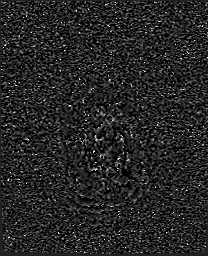

[Series 14: swi_images · axial · 3.0mm · 0.90mm/px · z∈[-134,+42]mm · 5 of 60 slices shown]
[im 1/60]
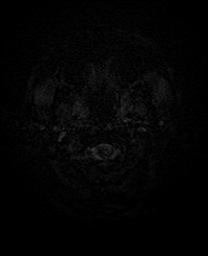
[im 15/60]
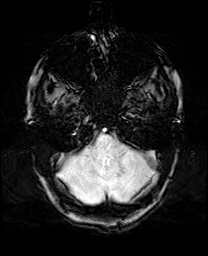
[im 30/60]
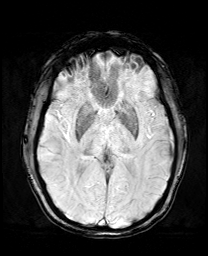
[im 45/60]
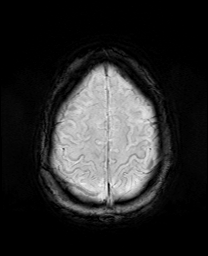
[im 60/60]
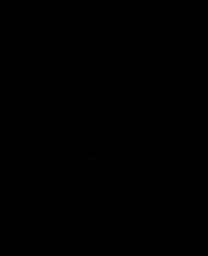

[Series 15: mip_images(sw) · axial · 24.0mm · 0.90mm/px · z∈[-124,+32]mm · 4 of 53 slices shown]
[im 1/53]
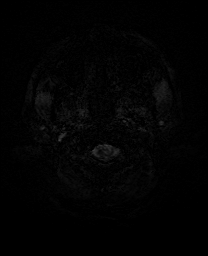
[im 18/53]
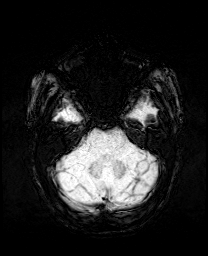
[im 35/53]
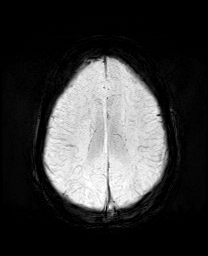
[im 53/53]
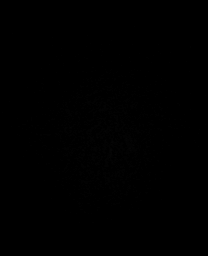

[Series 17: T2 · coronal · 5.0mm · 0.34mm/px · 2 of 29 slices shown (2 of 2)]
[im 1/29]
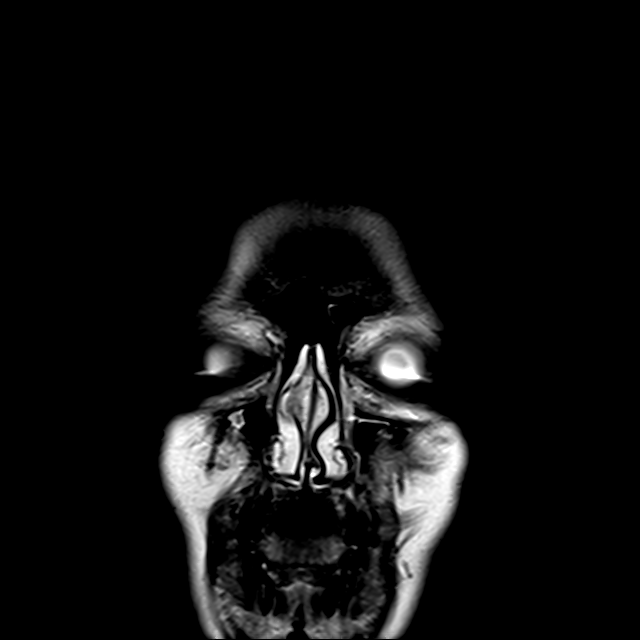
[im 29/29]
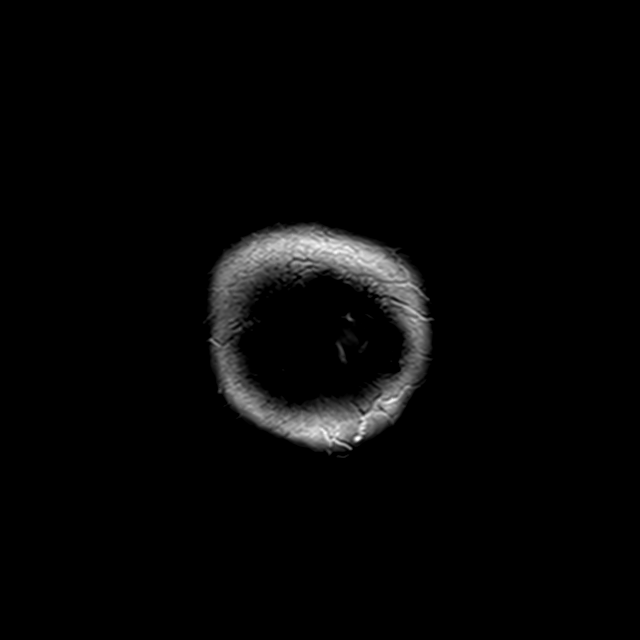

[43 of 48 positions shown; findings below may reference images not displayed]

FINDINGS: Brain: Cerebral volume within normal limits for patient age. No
focal parenchymal signal abnormality identified.

No abnormal foci of restricted diffusion to suggest acute or
subacute ischemia. Gray-white matter differentiation well
maintained. No encephalomalacia to suggest chronic infarction. No
foci of susceptibility artifact to suggest acute or chronic
intracranial hemorrhage.

No mass lesion, midline shift or mass effect. No hydrocephalus. No
extra-axial fluid collection. Major dural sinuses are grossly
patent.

Pituitary gland and suprasellar region are normal. Midline
structures intact and normal.

Vascular: Major intracranial vascular flow voids well maintained and
normal in appearance.

Skull and upper cervical spine: Craniocervical junction normal.
Visualized upper cervical spine within normal limits. Bone marrow
signal intensity normal. No scalp soft tissue abnormality.

Sinuses/Orbits: Globes and orbital soft tissues within normal
limits.

Mild scattered mucosal thickening noted within the ethmoidal air
cells and maxillary sinuses. Paranasal sinuses are otherwise clear.
No mastoid effusion. Inner ear structures grossly within normal
limits.

Other: None.
IMPRESSION: Normal brain MRI. No acute intracranial abnormality identified.

## 2021-06-13 ENCOUNTER — Other Ambulatory Visit: Payer: Self-pay

## 2021-06-13 ENCOUNTER — Ambulatory Visit (INDEPENDENT_AMBULATORY_CARE_PROVIDER_SITE_OTHER): Payer: Self-pay | Admitting: Psychiatry

## 2021-06-13 DIAGNOSIS — F4310 Post-traumatic stress disorder, unspecified: Secondary | ICD-10-CM

## 2021-06-13 NOTE — Progress Notes (Signed)
°      [facility] Counselor/Therapist Progress Note  Patient ID: Amber Taylor, MRN: [id number],    Date: 06/13/2021  Time Spent: 50 minutes start time 11:07 AM end time 11:57 AM  Treatment Type: Individual Therapy  Reported Symptoms: anxiety, triggered responses, sadness, crying spells  Mental Status Exam:  Appearance:   Well Groomed     Behavior:  Appropriate  Motor:  Normal  Speech/Language:   Normal Rate  Affect:  Appropriate  Mood:  anxious  Thought process:  normal  Thought content:    WNL  Sensory/Perceptual disturbances:    WNL  Orientation:  oriented to person, place, time/date, and situation  Attention:  Good  Concentration:  Good  Memory:  WNL  Fund of knowledge:   Good  Insight:    Good  Judgment:   Good  Impulse Control:  Good   Risk Assessment: Danger to Self:  No Self-injurious Behavior: No Danger to Others: No Duty to Warn:no Physical Aggression / Violence:No  Access to Firearms a concern: No  Gang Involvement:No   Subjective: Patient was present for session.  She shared that her aunt went missing and it was a huge issue and they finally found her but she had to handle everything in the situation.  There were lots of family dynamics that were stressful with the issue.  It created issues with her husband's family as well.  Patient reported she is trying to figure out if she gets triggered or has focusing issues.  Patient was encouraged to think through the situations and to see what is truly going on with her.  As she discussed the relationship with her husband and dynamics within their household she was able to recognize she often gets very overwhelmed and start shutting down which triggers her husband.  Discussed the differences in how they process information concerning being introverted or extroverted as well as the female and female brain.  Patient was encouraged to discuss these different things with her husband and ways that he could help her in those  moments.  Patient was also encouraged to communicate with all the parties at her work and recognize that she needs more data before she can take the project to the next level.  The importance of breaking things down into manageable pieces was discussed with patient.  Interventions: Cognitive Behavioral Therapy, Solution-Oriented/Positive Psychology, and Insight-Oriented  Diagnosis:   ICD-10-CM   1. PTSD (post-traumatic stress disorder)  F43.10       Plan: Patient is to use CBT and coping skills to decrease triggered responses.  Patient is to work on breaking things down into smaller pieces.  Patient is to try and recognize situations that get overwhelming for her and how she handles them.  Patient is to discuss the differences in her and her husband with her husband and the importance of figuring out a plan to deal with those differences appropriately.  Patient is to continue working with providers on medical issues. Long-term goal: Develop and implement effective coping skills to carry out normal responsibilities and participate constructively in relationships Short-term goal: Practice implement relaxation training as a coping mechanism for tension panic stress anger and anxiety-decrease intrusive thoughts by 50%  Lina Sayre, [facility]

## 2021-06-27 ENCOUNTER — Ambulatory Visit: Payer: Self-pay | Admitting: Psychiatry

## 2021-06-27 ENCOUNTER — Other Ambulatory Visit: Payer: Self-pay

## 2021-07-18 ENCOUNTER — Other Ambulatory Visit: Payer: Self-pay

## 2021-07-18 ENCOUNTER — Ambulatory Visit (INDEPENDENT_AMBULATORY_CARE_PROVIDER_SITE_OTHER): Payer: Self-pay | Admitting: Psychiatry

## 2021-07-18 DIAGNOSIS — F4310 Post-traumatic stress disorder, unspecified: Secondary | ICD-10-CM

## 2021-07-18 NOTE — Progress Notes (Signed)
°      [facility] Counselor/Therapist Progress Note  Patient ID: Amber Taylor, MRN: [id number],    Date: 07/18/2021  Time Spent: 50 minutes start time 11:07 AM end time 11:57 AM   Treatment Type: Individual Therapy  Reported Symptoms: anxiety, triggered responses, sadness  Mental Status Exam:  Appearance:   Well Groomed     Behavior:  Appropriate  Motor:  Normal  Speech/Language:   Normal Rate  Affect:  Appropriate  Mood:  anxious  Thought process:  normal  Thought content:    WNL  Sensory/Perceptual disturbances:    WNL  Orientation:  oriented to person, place, time/date, and situation  Attention:  Good  Concentration:  Good  Memory:  WNL  Fund of knowledge:   Good  Insight:    Good  Judgment:   Good  Impulse Control:  Good   Risk Assessment: Danger to Self:  No Self-injurious Behavior: No Danger to Others: No Duty to Warn:no Physical Aggression / Violence:No  Access to Firearms a concern: No  Gang Involvement:No   Subjective: Patient was present for session.  She shared that things have been very stressful at work and she is trying to manage things appropriately.  She is trying to set limits and recognize what she needs to let go of rather than over functioning as she has in the past.  Patient reported her husband wanted to come to the next session.  She shared that after last session she realized that they were dealing with 2 very different personalities and that was causing some clashes.  She shared in some ways they complement each other very well and in other ways they can trigger each other.  Patient was able to share some of the ways that she gets triggered and expressed some concerns for her husband's triggers because she is not sure exactly what they are.  Agreed that those issues will be addressed in next session and hopefully some ways to resolve things appropriately can be addressed with both of them together.  Patient and clinician did discuss a few solutions  with the situation that she will go ahead and try to put into motion.  Interventions: Cognitive Behavioral Therapy and Solution-Oriented/Positive Psychology  Diagnosis:   ICD-10-CM   1. PTSD (post-traumatic stress disorder)  F43.10       Plan: Patient is to use CBT and coping skills to decrease triggered responses.  Patient is to work on breaking things down into smaller pieces.  Patient is to try and recognize situations that get overwhelming for her and how she handles them.  Patient is to work on developing an organized place in her home where her husband can go to calm himself and disengage from stressors in his life.  Patient is to continue working with providers on medical issues. Long-term goal: Develop and implement effective coping skills to carry out normal responsibilities and participate constructively in relationships Short-term goal: Practice implement relaxation training as a coping mechanism for tension panic stress anger and anxiety-decrease intrusive thoughts by 50%  Stevphen Meuse, [facility]

## 2021-08-15 ENCOUNTER — Ambulatory Visit (INDEPENDENT_AMBULATORY_CARE_PROVIDER_SITE_OTHER): Payer: Self-pay | Admitting: Psychiatry

## 2021-08-15 ENCOUNTER — Other Ambulatory Visit: Payer: Self-pay

## 2021-08-15 DIAGNOSIS — F4310 Post-traumatic stress disorder, unspecified: Secondary | ICD-10-CM

## 2021-08-15 NOTE — Progress Notes (Signed)
?  [facility] Counselor/Therapist Progress Note ? ?Patient ID: MYRLA MALANOWSKI, MRN: [id number],   ? ?Date: 08/15/2021 ? ?Time Spent: 50 minutes start time 9:08 AM end time 9:58 AM ? ?Treatment Type: Individual Therapy ? ?Reported Symptoms: anxiety, triggered responses, sadness ? ?Mental Status Exam: ? ?Appearance:   Well Groomed     ?Behavior:  Appropriate  ?Motor:  Normal  ?Speech/Language:   Normal Rate  ?Affect:  Appropriate  ?Mood:  normal  ?Thought process:  normal  ?Thought content:    WNL  ?Sensory/Perceptual disturbances:    WNL  ?Orientation:  oriented to person, place, time/date, and situation  ?Attention:  Good  ?Concentration:  Good  ?Memory:  WNL  ?Fund of knowledge:   Good  ?Insight:    Good  ?Judgment:   Good  ?Impulse Control:  Good  ? ?Risk Assessment: ?Danger to Self:  No ?Self-injurious Behavior: No ?Danger to Others: No ?Duty to Warn:no ?Physical Aggression / Violence:No  ?Access to Firearms a concern: No  ?Gang Involvement:No  ? ?Subjective: Patient was present for session.  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  Patient explained that it was very peaceful to her and helped her get perspective.  Patient stated that would have been used at last session has helped her to be able to respond differently to her husband which has been a positive thing.  She went on to share that she is going to have to change jobs due to the situation with work.  She is also trying to figure out how to deal with her children and making sure they get their home school needs met.  Patient was able to recognize that she is handling things well and that continuing to work on perspective makes a difference in how she interacts with her husband and how he interacts with her.  Patient was encouraged to continue working on her CBT skills and maintaining appropriate perspective.  She was also encouraged her journaling and writing down things that trigger her so they can be worked through in  future sessions. ? ?Interventions: Cognitive Behavioral Therapy and Solution-Oriented/Positive Psychology ? ?Diagnosis: ?  ICD-10-CM   ?1. PTSD (post-traumatic stress disorder)  F43.10   ?  ? ? ?Plan: Patient is to use CBT and coping skills to decrease triggered responses.  Patient is to work on breaking things down into smaller pieces.  Patient is to try and recognize situations that get overwhelming for her and how she handles them.  Patient is to work on journaling things that seem to be triggered so that can be addressed in future sessions..  Patient is to continue working with providers on medical issues. ?Long-term goal: Develop and implement effective coping skills to carry out normal responsibilities and participate constructively in relationships ?Short-term goal: Practice implement relaxation training as a coping mechanism for tension panic stress anger and anxiety-decrease intrusive thoughts by 50% ? ?Lina Sayre, [facility] ? ? ? ? ? ? ? ? ? ? ? ? ? ? ? ? ? ? ?

## 2021-08-29 ENCOUNTER — Other Ambulatory Visit: Payer: Self-pay

## 2021-08-29 ENCOUNTER — Ambulatory Visit (INDEPENDENT_AMBULATORY_CARE_PROVIDER_SITE_OTHER): Payer: Self-pay | Admitting: Psychiatry

## 2021-08-29 DIAGNOSIS — F4310 Post-traumatic stress disorder, unspecified: Secondary | ICD-10-CM

## 2021-08-29 NOTE — Progress Notes (Signed)
?      [facility] Counselor/Therapist Progress Note ? ?Patient ID: Amber Taylor, MRN: [id number],   ? ?Date: 08/29/2021 ? ?Time Spent: 50 minutes start time 9:07 AM end time 9:57 AM ? ?Treatment Type: Individual Therapy ? ?Reported Symptoms: anxiety, triggered responses, health issues, sadness ? ?Mental Status Exam: ? ?Appearance:   Well Groomed     ?Behavior:  Appropriate  ?Motor:  Normal  ?Speech/Language:   Normal Rate  ?Affect:  Appropriate  ?Mood:  anxious  ?Thought process:  normal  ?Thought content:    WNL  ?Sensory/Perceptual disturbances:    WNL  ?Orientation:  oriented to person, place, time/date, and situation  ?Attention:  Good  ?Concentration:  Good  ?Memory:  WNL  ?Fund of knowledge:   Good  ?Insight:    Good  ?Judgment:   Good  ?Impulse Control:  Good  ? ?Risk Assessment: ?Danger to Self:  No ?Self-injurious Behavior: No ?Danger to Others: No ?Duty to Warn:no ?Physical Aggression / Violence:No  ?Access to Firearms a concern: No  ?Gang Involvement:No  ? ?Subjective: Patient was present for session. She shared that her daughter is having unexplained fatigue issues.  She went on to share that she realized it was time to work on her first job trauma because it was still upsetting her.  Did processing set on confrontation with Freda Munro, suds level 7, negative cognition "it is my fault" felt anxiety in her and her chest.  Patient was able to reduce suds level to 5.  She was able to recognize that there were multiple issues within that unit and that there is still a lot of her to be resolved.  Agreed to continue processing at next session.  Patient was encouraged to journal what surfaces between sessions. ? ?Interventions: Solution-Oriented/Positive Psychology and Eye Movement Desensitization and Reprocessing (EMDR) ? ?Diagnosis: ?  ICD-10-CM   ?1. PTSD (post-traumatic stress disorder)  F43.10   ?  ? ? ?Plan: Patient is to use CBT and coping skills to decrease triggered responses.  Patient is to work on  breaking things down into smaller pieces.  Patient is to try and recognize situations that get overwhelming for her and how she handles them.  Patient is to work on journaling things that seem to be triggered so that can be addressed in future sessions..  Patient is to continue working with providers on medical issues. ?Long-term goal: Develop and implement effective coping skills to carry out normal responsibilities and participate constructively in relationships ?Short-term goal: Practice implement relaxation training as a coping mechanism for tension panic stress anger and anxiety-decrease intrusive thoughts by 50% ? ?Lina Sayre, [facility] ? ? ? ? ? ? ? ? ? ? ? ? ? ? ? ? ? ? ?

## 2021-09-04 ENCOUNTER — Other Ambulatory Visit: Payer: Self-pay

## 2021-09-04 ENCOUNTER — Ambulatory Visit: Payer: Self-pay | Admitting: Psychiatry

## 2021-09-04 DIAGNOSIS — F4310 Post-traumatic stress disorder, unspecified: Secondary | ICD-10-CM

## 2021-09-04 NOTE — Progress Notes (Signed)
?      [facility] Counselor/Therapist Progress Note ? ?Patient ID: PECOLA HAXTON, MRN: [id number],   ? ?Date: 09/04/2021 ? ?Time Spent: 50 minutes start time 10:06 AM end time 10:56 AM ? ?Treatment Type: Individual Therapy ? ?Reported Symptoms: sadness, triggered responses, anxiety, rumination ? ?Mental Status Exam: ? ?Appearance:   Well Groomed     ?Behavior:  Appropriate  ?Motor:  Normal  ?Speech/Language:   Normal Rate  ?Affect:  Appropriate  ?Mood:  anxious and sad  ?Thought process:  normal  ?Thought content:    WNL  ?Sensory/Perceptual disturbances:    WNL  ?Orientation:  oriented to person, place, time/date, and situation  ?Attention:  Good  ?Concentration:  Good  ?Memory:  WNL  ?Fund of knowledge:   Good  ?Insight:    Good  ?Judgment:   Good  ?Impulse Control:  Good  ? ?Risk Assessment: ?Danger to Self:  No ?Self-injurious Behavior: No ?Danger to Others: No ?Duty to Warn:no ?Physical Aggression / Violence:No  ?Access to Firearms a concern: No  ?Gang Involvement:No  ? ?Subjective: Patient was present for session. She shared she was worked in for a session.  She shared she was still not doing well with what was brought up at last session.  Patient did processing set on talking with her boss on the phone, suds level 10, negative cognition "I am incompetent" felt anxiety in her shoulders.  Patient was able to reduce suds level to 5.  She reported feeling that she was able to get back to functioning after session.  Agreed to continue processing it further at next session. ? ?Interventions: Solution-Oriented/Positive Psychology and Eye Movement Desensitization and Reprocessing (EMDR) ? ?Diagnosis: ?  ICD-10-CM   ?1. PTSD (post-traumatic stress disorder)  F43.10   ?  ? ? ?Plan: Patient is to use CBT and coping skills to decrease triggered responses.  Patient is to work on breaking things down into smaller pieces.  Patient is to try and recognize situations that get overwhelming for her and how she handles them.   Patient is to work on journaling things that seem to be triggered so that can be addressed in future sessions..  Patient is to continue working with providers on medical issues. ?Long-term goal: Develop and implement effective coping skills to carry out normal responsibilities and participate constructively in relationships ?Short-term goal: Practice implement relaxation training as a coping mechanism for tension panic stress anger and anxiety-decrease intrusive thoughts by 50% ? ?Stevphen Meuse, [facility] ? ? ? ? ? ? ? ? ? ? ? ? ? ? ? ? ? ? ?

## 2021-09-13 ENCOUNTER — Ambulatory Visit (INDEPENDENT_AMBULATORY_CARE_PROVIDER_SITE_OTHER): Payer: Self-pay | Admitting: Psychiatry

## 2021-09-13 DIAGNOSIS — F4310 Post-traumatic stress disorder, unspecified: Secondary | ICD-10-CM

## 2021-09-13 NOTE — Progress Notes (Signed)
?      [facility] Counselor/Therapist Progress Note ? ?Patient ID: Amber Taylor, MRN: [id number],   ? ?Date: 09/13/2021 ? ?Time Spent: 50 minutes start time 10:06 AM end time 10:56 AM ? ?Treatment Type: Individual Therapy ? ?Reported Symptoms: anxiety, triggered responses, sadness, flashbacks ? ?Mental Status Exam: ? ?Appearance:   Well Groomed     ?Behavior:  Appropriate  ?Motor:  Normal  ?Speech/Language:   Normal Rate  ?Affect:  Appropriate  ?Mood:  anxious  ?Thought process:  normal  ?Thought content:    WNL  ?Sensory/Perceptual disturbances:    WNL  ?Orientation:  oriented to person, place, time/date, and situation  ?Attention:  Good  ?Concentration:  Good  ?Memory:  WNL  ?Fund of knowledge:   Good  ?Insight:    Good  ?Judgment:   Good  ?Impulse Control:  Good  ? ?Risk Assessment: ?Danger to Self:  No ?Self-injurious Behavior: No ?Danger to Others: No ?Duty to Warn:no ?Physical Aggression / Violence:No  ?Access to Firearms a concern: No  ?Gang Involvement:No  ? ?Subjective: Patient was present for session.  She shared she is doing better with the issues with her ex boss even though there are still some issues.  She wanted to process through issues with finances.  Patient did processing set on not being able to pay the bills, suds level 10, negative cognition "I am powerless" felt anxiety and sadness in her chest.  Patient was able to reduce suds level to 7.  She was able to recognize that she needed to have a discussion with her husband about the triggers that happened with the bill paying and finances.  Patient was encouraged to write things out that she realized during processing and as well as the bills that they have to pay so that she can discuss the situation with her husband and develop a plan that we will be more functional than what they are currently doing. ? ?Interventions: Solution-Oriented/Positive Psychology, Eye Movement Desensitization and Reprocessing (EMDR), and  Insight-Oriented ? ?Diagnosis: ?  ICD-10-CM   ?1. PTSD (post-traumatic stress disorder)  F43.10   ?  ? ? ?Plan: Patient is to use CBT and coping skills to decrease triggered responses.  Patient is to work on breaking things down into smaller pieces.  Patient is to follow plan from session to write out the triggers with the finances as well as the financial situation and communicate it with her husband.  Patient is to work on journaling things that seem to be triggered so that can be addressed in future sessions..  Patient is to continue working with providers on medical issues. ?Long-term goal: Develop and implement effective coping skills to carry out normal responsibilities and participate constructively in relationships ?Short-term goal: Practice implement relaxation training as a coping mechanism for tension panic stress anger and anxiety-decrease intrusive thoughts by 50% ? ?Stevphen Meuse, [facility] ? ? ? ? ? ? ? ? ? ? ? ? ? ? ? ? ? ? ?

## 2021-09-26 ENCOUNTER — Ambulatory Visit: Payer: Self-pay | Admitting: Psychiatry

## 2021-10-10 ENCOUNTER — Ambulatory Visit: Payer: Self-pay | Admitting: Psychiatry

## 2021-10-24 ENCOUNTER — Ambulatory Visit (INDEPENDENT_AMBULATORY_CARE_PROVIDER_SITE_OTHER): Payer: Self-pay | Admitting: Psychiatry

## 2021-10-24 DIAGNOSIS — F4310 Post-traumatic stress disorder, unspecified: Secondary | ICD-10-CM

## 2021-10-24 NOTE — Progress Notes (Signed)
?      [facility] Counselor/Therapist Progress Note ? ?Patient ID: Amber Taylor, MRN: [id number],   ? ?Date: 10/24/2021 ? ?Time Spent: 51 minutes start time 9:09 AM end time 10 AM ? ?Treatment Type: Individual Therapy ? ?Reported Symptoms: medical issues, anxiety, triggered responses ? ?Mental Status Exam: ? ?Appearance:   Well Groomed     ?Behavior:  Appropriate  ?Motor:  Normal  ?Speech/Language:   Normal Rate  ?Affect:  Appropriate  ?Mood:  anxious  ?Thought process:  normal  ?Thought content:    WNL  ?Sensory/Perceptual disturbances:    WNL  ?Orientation:  oriented to person, place, time/date, and situation  ?Attention:  Good  ?Concentration:  Good  ?Memory:  WNL  ?Fund of knowledge:   Good  ?Insight:    Good  ?Judgment:   Good  ?Impulse Control:  Good  ? ?Risk Assessment: ?Danger to Self:  No ?Self-injurious Behavior: No ?Danger to Others: No ?Duty to Warn:no ?Physical Aggression / Violence:No  ?Access to Firearms a concern: No  ?Gang Involvement:No  ? ?Subjective: Patient was present for session. Patient explained that she has been having medication changes that are not going well for patient.  Patient stated that it is causing lots of stress on her and that has been difficult.  She went on to share that there are disconnects between she and her husband and she wants to figure out how to communicate what she needs with him before the kids start home school in the fall.  Did processing set on the house looking terrible, suds level 8, negative cognition "I cannot keep up with it" felt overwhelmed and inadequate in her chest.  Patient was able to reduce suds level to 4.  She was able to recognize that she is doing a lot and she needs more assistance to be able to manage all she is managing in their small apartment with 3 kids.  Patient was able to think through what she needed from her husband and discussed different ways that she can communicate with with him regarding the situation.  Patient reported feeling  much better about plans from session at the end. ? ?Interventions: Solution-Oriented/Positive Psychology, Eye Movement Desensitization and Reprocessing (EMDR), and Insight-Oriented ? ?Diagnosis: ?  ICD-10-CM   ?1. PTSD (post-traumatic stress disorder)  F43.10   ?  ? ? ?Plan: Patient is to use CBT and coping skills to decrease triggered responses.  Patient is to work on breaking things down into smaller pieces.  Patient is to follow plan from session to to communicate it with her husband about what she needs to be able to do all she is expected to.  Patient is to work on journaling things that seem to be triggered so that can be addressed in future sessions..  Patient is to continue working with providers on medical issues. ?Long-term goal: Develop and implement effective coping skills to carry out normal responsibilities and participate constructively in relationships ?Short-term goal: Practice implement relaxation training as a coping mechanism for tension panic stress anger and anxiety-decrease intrusive thoughts by 50% ?  ? ?Stevphen Meuse, [facility] ? ? ? ? ? ? ? ? ? ? ? ? ? ? ? ? ? ? ?

## 2021-11-07 ENCOUNTER — Ambulatory Visit (INDEPENDENT_AMBULATORY_CARE_PROVIDER_SITE_OTHER): Payer: Self-pay | Admitting: Psychiatry

## 2021-11-07 DIAGNOSIS — F4310 Post-traumatic stress disorder, unspecified: Secondary | ICD-10-CM

## 2021-11-07 NOTE — Progress Notes (Signed)
      [facility] Counselor/Therapist Progress Note  Patient ID: Amber Taylor, MRN: [id number],    Date: 11/07/2021  Time Spent: 50 minutes start time 9:02 AM and time 9:52 AM  Treatment Type: Individual Therapy  Reported Symptoms: sadness, anxiety, focusing issues, fatigue, triggered responses  Mental Status Exam:  Appearance:   Well Groomed     Behavior:  Appropriate  Motor:  Normal  Speech/Language:   Normal Rate  Affect:  Appropriate  Mood:  anxious  Thought process:  normal  Thought content:    WNL  Sensory/Perceptual disturbances:    WNL  Orientation:  oriented to person, place, time/date, and situation  Attention:  Good  Concentration:  Good  Memory:  WNL  Fund of knowledge:   Good  Insight:    Good  Judgment:   Good  Impulse Control:  Good   Risk Assessment: Danger to Self:  No Self-injurious Behavior: No Danger to Others: No Duty to Warn:no Physical Aggression / Violence:No  Access to Firearms a concern: No  Gang Involvement:No   Subjective: Patient was present for session. She shared that her husband's grandmother is not doing well and  has stopped eating much.  She shared that she is 22.  Discussed the situation and encouraged patient is to continue supporting her husband through what is potentially going to happen.  Patient went on to share that she and her husband are struggling on some issues and she is realizing that some of the things shared from last session have been helpful but she wants to continue working on their communication.  Encouraged her to think through what triggers the issues.  She was able to identify that her husband telling her she can go down the steps alone after she had her seizures made her feel similar to being trapped with her mother.  The processing set on mom's telling her that the family fell apart when she left for college, suds level 9, negative cognition "I am trapped" felt anxiety and frustration in her chest and shoulders.   Patient was able to reduce suds level to 6.  She was able to recognize that it was not her responsibility to take care of her family but yet she felt that most of her life.  The importance of affirming the younger parts of her were discussed in session.  Interventions: Cognitive Behavioral Therapy, Solution-Oriented/Positive Psychology, and Eye Movement Desensitization and Reprocessing (EMDR)  Diagnosis:   ICD-10-CM   1. PTSD (post-traumatic stress disorder)  F43.10       Plan:  Patient is to use CBT and coping skills to decrease triggered responses.  Patient is to work on breaking things down into smaller pieces.  Patient is to follow plan from session to to communicate it with her husband about what she needs to be able to do all she is expected to.  Patient is to work on journaling things that seem to be triggered so that can be addressed in future sessions..  Patient is to continue working with providers on medical issues. Long-term goal: Develop and implement effective coping skills to carry out normal responsibilities and participate constructively in relationships Short-term goal: Practice implement relaxation training as a coping mechanism for tension panic stress anger and anxiety-decrease intrusive thoughts by 50%    Stevphen Meuse, [facility]

## 2021-11-09 ENCOUNTER — Other Ambulatory Visit: Payer: Self-pay

## 2021-11-09 ENCOUNTER — Encounter (HOSPITAL_BASED_OUTPATIENT_CLINIC_OR_DEPARTMENT_OTHER): Payer: Self-pay

## 2021-11-09 ENCOUNTER — Emergency Department (HOSPITAL_BASED_OUTPATIENT_CLINIC_OR_DEPARTMENT_OTHER)
Admission: EM | Admit: 2021-11-09 | Discharge: 2021-11-09 | Disposition: A | Discharge status: Home / Self Care | Payer: Self-pay | Attending: Emergency Medicine | Admitting: Emergency Medicine

## 2021-11-09 DIAGNOSIS — Z91010 Allergy to peanuts: Secondary | ICD-10-CM | POA: Insufficient documentation

## 2021-11-09 DIAGNOSIS — Z5181 Encounter for therapeutic drug level monitoring: Secondary | ICD-10-CM | POA: Insufficient documentation

## 2021-11-09 DIAGNOSIS — R42 Dizziness and giddiness: Secondary | ICD-10-CM | POA: Insufficient documentation

## 2021-11-09 DIAGNOSIS — Z79899 Other long term (current) drug therapy: Secondary | ICD-10-CM

## 2021-11-09 MED ORDER — ONDANSETRON HCL 4 MG/2ML IJ SOLN
4.0000 mg | Freq: Once | INTRAMUSCULAR | Status: AC
  Administered 2021-11-09: 4 mg via INTRAVENOUS
  Filled 2021-11-09: qty 2

## 2021-11-09 MED ORDER — SODIUM CHLORIDE 0.9 % IV BOLUS
1000.0000 mL | Freq: Once | INTRAVENOUS | Status: AC
  Administered 2021-11-09: 1000 mL via INTRAVENOUS

## 2021-11-09 MED ORDER — KETOROLAC TROMETHAMINE 15 MG/ML IJ SOLN
15.0000 mg | Freq: Once | INTRAMUSCULAR | Status: AC
  Administered 2021-11-09: 15 mg via INTRAVENOUS
  Filled 2021-11-09: qty 1

## 2021-11-09 MED ORDER — ONDANSETRON HCL 4 MG PO TABS: 4.0000 mg | ORAL_TABLET | Freq: Four times a day (QID) | ORAL | 0 refills | Status: AC

## 2021-11-09 NOTE — ED Triage Notes (Addendum)
Patient was not paying attention and accidentally took Xcopril 200mg  1 tablet and Lamictal XR 200mg  3 tablets. Patient is due to take these medications today but took them 12 hours earlier.   1119: Patient laying quietly at this time. Patient denies nausea/pain. Wanting to hold medications "just in case" No acute distress noted. Report given to oncoming nurse.

## 2021-11-09 NOTE — Discharge Instructions (Addendum)
Take your Vimpat tonight as scheduled.  Resume your 24-hour medications  tomorrow evening.  Speak with your neurologist for any further concerns or medication adverse effects.

## 2021-11-09 NOTE — ED Notes (Signed)
Discharge instructions reviewed with patient. Patient verbalizes understanding, no further questions at this time. Medications/prescriptions and follow up information provided. No acute distress noted at time of departure.  

## 2021-11-09 NOTE — ED Provider Notes (Signed)
[facility] EMERGENCY DEPARTMENT Provider Note   CSN: [id number] Arrival date & time: 11/09/21  1007     History  Chief Complaint  Patient presents with   Medication Management    Amber Taylor is a 41 y.o. female with a past medical history of epilepsy presenting today with concern of taking too much of her medication.  She is on 600mg  XR lamotrigine, 200mg  Vimpat and 200mg  XCOPRI for her epilepsy and accidentally took her lamotrigine and Xcopri this morning when it was not due until tonight.  These are 24-hour medications so she took it 12 hours early.  Reports that she has done this with the lamotrigine in the past and it caused her to feel very drowsy and "unresponsive" where she only responds to pain.  She also took her Vimpat this morning but it was due at that time because it is a twice daily medication.  Ports that right now she is feeling dizzy/drowsy and "like her brain is sloshing around in her head."  Says all of this is more uncomfortable than labor pains.  When this happened in the past no one could figure out what was wrong but after she was given IVF she felt better.  Requesting fluids at this time.   HPI     Home Medications Prior to Admission medications   Medication Sig Start Date End Date Taking? Authorizing Provider  acetaminophen (TYLENOL) 500 MG tablet Take 2 tablets (1,000 mg total) by mouth every 6 (six) hours. 06/02/20   , MD  ibuprofen (ADVIL) 800 MG tablet Take 1 tablet (800 mg total) by mouth every 8 (eight) hours. 06/02/20   06/04/20, MD  LamoTRIgine 200 MG TB24 24 hour tablet Take 600 mg by mouth at bedtime. (PATIENT TO START POST DELIVERY)    [provider]  Levomefolate Glucosamine (METHYLFOLATE PO) Take 800 mcg by mouth at bedtime.    [provider]  LORazepam (ATIVAN) 0.5 MG tablet Take 1 tablet (0.5 mg total) by mouth every 8 (eight) hours as needed for seizure. 06/02/20   06/04/20, MD   oxyCODONE (OXY IR/ROXICODONE) 5 MG immediate release tablet Take 1-2 tablets (5-10 mg total) by mouth every 4 (four) hours as needed for moderate pain. 06/02/20   06/04/20, MD  Prenatal Vit-Fe Fumarate-FA (PRENATAL MULTIVITAMIN) TABS tablet Take 1 tablet by mouth daily at 12 noon. Patient taking differently: Take 1 tablet by mouth at bedtime. 05/01/16   Bovard-Stuckert, Jody, MD  VIMPAT 200 MG TABS tablet Take 200 mg by mouth 2 (two) times daily. 03/26/16   [provider]      Allergies    Peanut-containing drug products, Prednisone, Augmentin [amoxicillin-pot clavulanate], and Penicillins    Review of Systems   Review of Systems  Physical Exam Updated Vital Signs BP 123/81 (BP Location: Right Arm)   Pulse 84   Temp 98.8 F (37.1 C) (Oral)   Resp 14   Ht 5\' 8"  (1.727 m)   Wt 59 kg   LMP 09/10/2021   SpO2 100%   BMI 19.77 kg/m  Physical Exam Vitals and nursing note reviewed.  Constitutional:      Appearance: Normal appearance. She is not ill-appearing.  HENT:     Head: Normocephalic and atraumatic.  Eyes:     General: No scleral icterus.    Extraocular Movements: Extraocular movements intact.     Conjunctiva/sclera: Conjunctivae normal.     Pupils: Pupils are equal, round, and reactive to light.  Cardiovascular:     Rate and Rhythm: Normal rate and regular rhythm.  Pulmonary:     Effort: Pulmonary effort is normal. No respiratory distress.  Skin:    Findings: No rash.  Neurological:     Mental Status: She is alert.  Psychiatric:        Mood and Affect: Mood normal.        Behavior: Behavior normal.    ED Results / Procedures / Treatments   Labs (all labs ordered are listed, but only abnormal results are displayed) Labs Reviewed - No data to display  EKG EKG Interpretation  Date/Time:  Thursday November 09 2021 10:18:44 EDT Ventricular Rate:  87 PR Interval:  137 QRS Duration: 101 QT Interval:  346 QTC Calculation: 417 R Axis:   89 Text  Interpretation: Sinus rhythm Confirmed by Tanda Rockers ([id number]) on 11/09/2021 11:11:23 AM  Radiology No results found.  Procedures Procedures   Medications Ordered in ED Medications  ketorolac (TORADOL) 15 MG/ML injection 15 mg (0 mg Intravenous Hold 11/09/21 1108)  ondansetron (ZOFRAN) injection 4 mg (0 mg Intravenous Hold 11/09/21 1107)  sodium chloride 0.9 % bolus 1,000 mL (1,000 mLs Intravenous New Bag/Given 11/09/21 1107)    ED Course/ Medical Decision Making/ A&P                           Medical Decision Making Risk Prescription drug management.   41 year old female with epilepsy presenting today after accidentally taking her medication early.  Complaining of feeling drowsy, dizzy and like her "brain is moving around in her head."  Past Medical History / Co-morbidities / Social History: epilepsy   Physical Exam: Physical exam performed. The pertinent findings include: None. Vital signs stable.  EKG and cardiac monitor in normal sinus rhythm.  No indication for labs at this time.   Cardiac Monitoring:  The patient was maintained on a cardiac monitor.  My attending physician Dr. Wallace Cullens viewed and interpreted the cardiac monitored which showed an underlying rhythm of: NSR   Medications: I ordered medication including IVF. Reevaluation of the patient after these medicines showed that the patient improved. I have reviewed the patients home medicines and have made adjustments as needed.  Disposition: After consideration of the diagnostic results and the patients response to treatment, I feel that patient remained stable and reports feeling better after fluids .    I discussed this case with my attending physician Dr. Wallace Cullens who cosigned this note including patient's presenting symptoms, physical exam, and planned diagnostics and interventions. Attending physician stated agreement with plan or made changes to plan which were implemented.     Final Clinical Impression(s) / ED  Diagnoses Final diagnoses:  Medication taken at higher dose than recommended    Rx / DC Orders  Results and diagnoses were explained to the patient. Return precautions discussed in full. Patient had no additional questions and expressed complete understanding.   This chart was dictated using voice recognition software.  Despite best efforts to proofread,  errors can occur which can change the documentation meaning.    Saddie Benders, PA-C 11/09/21 1156    Tanda Rockers A, DO 11/09/21 1530

## 2021-11-09 NOTE — ED Notes (Signed)
Ambulatory to restroom without difficulty.

## 2021-11-21 ENCOUNTER — Ambulatory Visit: Payer: Self-pay | Admitting: Psychiatry

## 2021-12-05 ENCOUNTER — Ambulatory Visit: Payer: Self-pay | Admitting: Psychiatry

## 2021-12-26 ENCOUNTER — Ambulatory Visit (INDEPENDENT_AMBULATORY_CARE_PROVIDER_SITE_OTHER): Payer: Self-pay | Admitting: Psychiatry

## 2021-12-26 DIAGNOSIS — F4310 Post-traumatic stress disorder, unspecified: Secondary | ICD-10-CM

## 2021-12-26 NOTE — Progress Notes (Signed)
      [facility] Counselor/Therapist Progress Note  Patient ID: Amber Taylor, MRN: [id number],    Date: 12/26/2021  Time Spent: 50 minutes start time 11:03 AM end time 11:53 AM  Treatment Type: Individual Therapy  Reported Symptoms: anxiety, triggered responses, sadness  Mental Status Exam:  Appearance:   Well Groomed     Behavior:  Appropriate  Motor:  Normal  Speech/Language:   Normal Rate  Affect:  Appropriate  Mood:  anxious  Thought process:  normal  Thought content:    WNL  Sensory/Perceptual disturbances:    WNL  Orientation:  oriented to person, place, time/date, and situation  Attention:  Good  Concentration:  Good  Memory:  WNL  Fund of knowledge:   Good  Insight:    Good  Judgment:   Good  Impulse Control:  Good   Risk Assessment: Danger to Self:  No Self-injurious Behavior: No Danger to Others: No Duty to Warn:no Physical Aggression / Violence:No  Access to Firearms a concern: No  Gang Involvement:No   Subjective: Patient was present for session. She shared that his husband's grandmother is dying and she is having to take care of her.  She shared that there have been lots of conflicts.She shared that she is concerned about her son's hearing and some of her older son's behaviors.  Patient went on to explain that her husband's grandmother passed the baton on to her and encouraged her to care for all the men in the family after she goes.  She also encouraged her to take care of herself which patient stated was very healing for her and felt like a blessing.  Patient stated she is trying to figure things out with her work situation.  Discussed different ways that she can handle the stress of work and be able to set realistic expectations for them concerning what she would do.  Patient was also encouraged to talk to her husband about the stress she is feeling and see if he can help her come up with a plan that they can both get their needs met and be able to move  forward.  Patient was encouraged to feel good about the progress she is making and to make sure she does the right things for herself during this very difficult time.  Interventions: Solution-Oriented/Positive Psychology and Insight-Oriented  Diagnosis:   ICD-10-CM   1. PTSD (post-traumatic stress disorder)  F43.10       Plan: Patient is to use CBT and coping skills to decrease triggered responses.  Patient is to work on breaking things down into smaller pieces.  Patient is to follow plan from session to to communicate it with her husband about what she needs to be able to do all she is expected to.  Patient is to work on journaling things that seem to be triggered so that can be addressed in future sessions..  Patient is to continue working with providers on medical issues. Long-term goal: Develop and implement effective coping skills to carry out normal responsibilities and participate constructively in relationships Short-term goal: Practice implement relaxation training as a coping mechanism for tension panic stress anger and anxiety-decrease intrusive thoughts by 50%    Lina Sayre, [facility]

## 2022-01-09 ENCOUNTER — Ambulatory Visit (INDEPENDENT_AMBULATORY_CARE_PROVIDER_SITE_OTHER): Payer: Self-pay | Admitting: Psychiatry

## 2022-01-09 DIAGNOSIS — F4310 Post-traumatic stress disorder, unspecified: Secondary | ICD-10-CM

## 2022-01-09 NOTE — Progress Notes (Signed)
[facility] Counselor/Therapist Progress Note  Patient ID: RAENELL MENSING, MRN: [id number],    Date: 01/09/2022  Time Spent: 50 minutes start time 10:06 AM end time 10:56 AM  Treatment Type: Individual Therapy  Reported Symptoms: anxiety, sadness, triggered responses  Mental Status Exam:  Appearance:   Casual and Neat     Behavior:  Appropriate  Motor:  Normal  Speech/Language:   Normal Rate  Affect:  Appropriate  Mood:  normal  Thought process:  normal  Thought content:    WNL  Sensory/Perceptual disturbances:    WNL  Orientation:  oriented to person, place, time/date, and situation  Attention:  Good  Concentration:  Good  Memory:  WNL  Fund of knowledge:   Good  Insight:    Good  Judgment:   Good  Impulse Control:  Good   Risk Assessment: Danger to Self:  No Self-injurious Behavior: No Danger to Others: No Duty to Warn:no Physical Aggression / Violence:No  Access to Firearms a concern: No  Gang Involvement:No   Subjective: Patient was present for session. She shared her husband's grandmother did pass away.  She shared that the funeral had lots of drama due to his sister coming in and saying things that were difficult for patient.  Patient was allowed time to process the things from the funeral that happened and think through what she needed to keep and what she needed to let go of.  Patient was able to recognize that most of what her sister-in-law said she does not need to give energy to and she needs to just move forward and not let it impact her patient shared that her husband often says she makes him feel emasculated.  Processed what she feels he means by that.  Encouraged her to share her heart with her husband and why sometimes things happen the way that they do including her having some memory issues and difficulty with focusing due to her medications for seizures.  Patient was also encouraged to write down the things that she needs from her husband and have him  write down the things that he needs from her so that she could remember that and they can focus on those things as well as each of them write down what is in their heart for the other person so when they start feeling agitated they can remember that as well.  All plans were written out and given to the patient on an index card.  Interventions: Assertiveness/Communication, Solution-Oriented/Positive Psychology, and Insight-Oriented  Diagnosis:   ICD-10-CM   1. PTSD (post-traumatic stress disorder)  F43.10       Plan: Patient is to use CBT and coping skills to decrease triggered responses.  Patient is to work on breaking things down into smaller pieces.  Patient is to follow plan from session to to communicate it with her husband about what she needs to be able to do all she is expected to.  Patient is to work on journaling things that seem to be triggered so that can be addressed in future sessions..  Patient is to continue working with providers on medical issues. Long-term goal: Develop and implement effective coping skills to carry out normal responsibilities and participate constructively in relationships Short-term goal: Practice implement relaxation training as a coping mechanism for tension panic stress anger and anxiety-decrease intrusive thoughts by 50%  Stevphen Meuse, [facility]

## 2022-01-23 ENCOUNTER — Ambulatory Visit (INDEPENDENT_AMBULATORY_CARE_PROVIDER_SITE_OTHER): Payer: Self-pay | Admitting: Psychiatry

## 2022-01-30 ENCOUNTER — Ambulatory Visit (INDEPENDENT_AMBULATORY_CARE_PROVIDER_SITE_OTHER): Payer: Self-pay | Admitting: Psychiatry

## 2022-01-30 DIAGNOSIS — F4310 Post-traumatic stress disorder, unspecified: Secondary | ICD-10-CM

## 2022-01-30 NOTE — Progress Notes (Signed)
      [facility] Counselor/Therapist Progress Note  Patient ID: Amber Taylor, MRN: [id number],    Date: 01/30/2022  Time Spent: 50 minutes start time 1:08 PM end time 1:58 PM  Treatment Type: Individual Therapy  Reported Symptoms: anxiety, triggered responses, flashbacks  Mental Status Exam:  Appearance:   Well Groomed     Behavior:  Appropriate  Motor:  Normal  Speech/Language:   Normal Rate  Affect:  Appropriate  Mood:  anxious  Thought process:  normal  Thought content:    WNL  Sensory/Perceptual disturbances:    WNL  Orientation:  oriented to person, place, time/date, and situation  Attention:  Good  Concentration:  Good  Memory:  WNL  Fund of knowledge:   Good  Insight:    Good  Judgment:   Good  Impulse Control:  Good   Risk Assessment: Danger to Self:  No Self-injurious Behavior: No Danger to Others: No Duty to Warn:no Physical Aggression / Violence:No  Access to Firearms a concern: No  Gang Involvement:No   Subjective: Patient was present for session. She shared that work is stressful but she is trying.  She went on to share that the Co op first meeting was difficult and her husband had to step in for her. Her father in law may have a brain tumor and is having lots of cognitive issues.  Patient shared that the biggest issue for her currently is that she struggles when plans are changed.  Did processing set on of planned something and then someone says were not doing that, suds level 7, negative cognition "I am wrong" felt anxiety and fear in her stomach and chest.  Patient was able to reduce suds level to 3.  Through the processing she was able to recognize that some of the issue is coming from feelings of abandonment and uncertainty of her role now in her brother's life.  She also was able to realize that her husband is reminding her of things that she did a long time ago and is not recognizing her growth and improvement.  Encouraged patient to continue allowing  processing to surface and we will continue addressing issues at next session.  Interventions: Eye Movement Desensitization and Reprocessing (EMDR) and Insight-Oriented  Diagnosis:   ICD-10-CM   1. PTSD (post-traumatic stress disorder)  F43.10       Plan: Patient is to use CBT and coping skills to decrease triggered responses.  Patient is to work on breaking things down into smaller pieces.  Patient is to follow plan from session to to communicate it with her husband about what she needs to be able to do all she is expected to.  Patient is to work on journaling things that seem to be triggered so that can be addressed in future sessions..  Patient is to continue working with providers on medical issues. Long-term goal: Develop and implement effective coping skills to carry out normal responsibilities and participate constructively in relationships Short-term goal: Practice implement relaxation training as a coping mechanism for tension panic stress anger and anxiety-decrease intrusive thoughts by 50%  Address issues of fear of abandonment and changes in identity at next session.    Stevphen Meuse, [facility]

## 2022-02-15 ENCOUNTER — Ambulatory Visit (INDEPENDENT_AMBULATORY_CARE_PROVIDER_SITE_OTHER): Payer: Self-pay | Admitting: Psychiatry

## 2022-02-15 DIAGNOSIS — F4310 Post-traumatic stress disorder, unspecified: Secondary | ICD-10-CM

## 2022-02-15 NOTE — Progress Notes (Signed)
      [facility] Counselor/Therapist Progress Note  Patient ID: Amber Taylor, MRN: [id number],    Date: 02/15/2022  Time Spent: 50 minutes start time 12:01 PM end time 12:51 PM  Treatment Type: Individual Therapy  Reported Symptoms: Anxiety, flashbacks, fatigue, health issues, triggered responses  Mental Status Exam:  Appearance:   Well Groomed     Behavior:  Appropriate  Motor:  Normal  Speech/Language:   Normal Rate  Affect:  Appropriate  Mood:  anxious  Thought process:  normal  Thought content:    WNL  Sensory/Perceptual disturbances:    WNL  Orientation:  oriented to person, place, time/date, and situation  Attention:  Good  Concentration:  Good  Memory:  WNL  Fund of knowledge:   Good  Insight:    Good  Judgment:   Good  Impulse Control:  Good   Risk Assessment: Danger to Self:  No Self-injurious Behavior: No Danger to Others: No Duty to Warn:no Physical Aggression / Violence:No  Access to Firearms a concern: No  Gang Involvement:No   Subjective: Patient was present for session.  Patient stated that she had had flashbacks since last session.  She shared that she wanted to address the issues due to continually being triggered.  Did processing set on mom refusing to care for her brother.  Suds level 10, negative cognition "I am powerless" felt anxiety and frustration and grief in her chest and head.  Patient was able to reduce suds level to 2.  She was able to recognize the ways that she was still able to make a difference and how she can make a difference with the situation with her husband.  Patient was able to discuss when and why she gets triggered with her husband and different strategies to help talk herself through that were addressed in session.  Interventions: Eye Movement Desensitization and Reprocessing (EMDR) and Insight-Oriented  Diagnosis:   ICD-10-CM   1. PTSD (post-traumatic stress disorder)  F43.10       Plan:  Patient is to use CBT and coping  skills to decrease triggered responses.  Patient is to work on breaking things down into smaller pieces.  Patient is to follow plan from session to to communicate it with her husband about what she needs to be able to do all she is expected to.  Patient is to work on journaling things that seem to be triggered so that can be addressed in future sessions..  Patient is to continue working with providers on medical issues. Long-term goal: Develop and implement effective coping skills to carry out normal responsibilities and participate constructively in relationships Short-term goal: Practice implement relaxation training as a coping mechanism for tension panic stress anger and anxiety-decrease intrusive thoughts by 50%   Address issues of fear of abandonment and changes in identity at next session.    Stevphen Meuse, [facility]

## 2022-03-06 ENCOUNTER — Ambulatory Visit (INDEPENDENT_AMBULATORY_CARE_PROVIDER_SITE_OTHER): Payer: Self-pay | Admitting: Psychiatry

## 2022-03-06 DIAGNOSIS — F4310 Post-traumatic stress disorder, unspecified: Secondary | ICD-10-CM

## 2022-03-06 NOTE — Progress Notes (Signed)
      [facility] Counselor/Therapist Progress Note  Patient ID: Amber Taylor, MRN: [id number],    Date: 03/06/2022  Time Spent: 57 start time 11:02 AM end 11:52 AM  Treatment Type: Individual Therapy  Reported Symptoms: health issues, sadness, triggered responses  Mental Status Exam:  Appearance:   Well Groomed     Behavior:  Appropriate  Motor:  Normal  Speech/Language:   Normal Rate  Affect:  Appropriate  Mood:  normal  Thought process:  normal  Thought content:    WNL  Sensory/Perceptual disturbances:    WNL  Orientation:  oriented to person, place, time/date, and situation  Attention:  Good  Concentration:  Good  Memory:  WNL  Fund of knowledge:   Good  Insight:    Good  Judgment:   Good  Impulse Control:  Good   Risk Assessment: Danger to Self:  No Self-injurious Behavior: No Danger to Others: No Duty to Warn:no Physical Aggression / Violence:No  Access to Firearms a concern: No  Gang Involvement:No   Subjective: Patient was present for session.  She shared that she had a bad health day but was feeling better currently. She stated that work has been stressful but going okay overall.  She and her husband are looking into buying a house.  Patient stated she is excited about the process but it is still very overwhelming.  Patient stated she was not willing to try doing any EMDR processing due to her feeling seizure as over the past couple days.  Patient was encouraged to think through what she would like to try and talk through in session.  She shared she is still trying to work on communication with her husband and to think through how to get the needs met for both of them.  Patient was given time to discuss what was the issues that seem to be creating the communication problems.  After doing that she was encouraged to figure out what she needs and what she feels like her husband needs and a plan was developed for her to communicate with her husband.  Patient was  encouraged to recognize that some of the issues seem to be the differences in the female and female brain and that is very normal but still needs to be discussed.  Patient reported feeling positive about the situation and plan.  Interventions: Solution-Oriented/Positive Psychology and Insight-Oriented  Diagnosis:   ICD-10-CM   1. PTSD (post-traumatic stress disorder)  F43.10       Plan: Patient is to use CBT and coping skills to decrease triggered responses.  Patient is to work on breaking things down into smaller pieces.  Patient is to follow plan from session to to communicate plan discussed as well as differences in female-female brain with her husband. Patient is to work on journaling things that seem to be triggered so that can be addressed in future sessions..  Patient is to continue working with providers on medical issues. Long-term goal: Develop and implement effective coping skills to carry out normal responsibilities and participate constructively in relationships Short-term goal: Practice implement relaxation training as a coping mechanism for tension panic stress anger and anxiety-decrease intrusive thoughts by 50%   Address issues of fear of abandonment and changes in identity at next session.  Lina Sayre, [facility]

## 2022-03-20 ENCOUNTER — Ambulatory Visit (INDEPENDENT_AMBULATORY_CARE_PROVIDER_SITE_OTHER): Payer: Self-pay | Admitting: Psychiatry

## 2022-03-20 DIAGNOSIS — F4310 Post-traumatic stress disorder, unspecified: Secondary | ICD-10-CM

## 2022-03-20 NOTE — Progress Notes (Signed)
      [facility] Counselor/Therapist Progress Note  Patient ID: KORINNA TAT, MRN: [id number],    Date: 03/20/2022  Time Spent: 50 minutes start time 11:05 AM end time 11:55 AM  Treatment Type: Individual Therapy  Reported Symptoms: triggered responses, anxiety, flashbacks  Mental Status Exam:  Appearance:   Well Groomed     Behavior:  Appropriate  Motor:  Normal  Speech/Language:   Normal Rate  Affect:  Appropriate  Mood:  anxious  Thought process:  normal  Thought content:    WNL  Sensory/Perceptual disturbances:    WNL  Orientation:  oriented to person, place, time/date, and situation  Attention:  Good  Concentration:  Good  Memory:  WNL  Fund of knowledge:   Good  Insight:    Good  Judgment:   Good  Impulse Control:  Good   Risk Assessment: Danger to Self:  No Self-injurious Behavior: No Danger to Others: No Duty to Warn:no Physical Aggression / Violence:No  Access to Firearms a concern: No  Gang Involvement:No   Subjective: Patient was present for session. She shared she had followed through on plans with her children and she felt that they were helpful. She shared she had a flashback when she made a mistake with her nanny's pay, SUDS level 8 negative cognition "the world is ending due to my mistake" felt despair in her chest.  Patient was able to reduce SUDS level to 2 and reported having a sense of peace about things.  She was able to recognize that things can be okay even if she makes a mistake.  Interventions: Solution-Oriented/Positive Psychology, Eye Movement Desensitization and Reprocessing (EMDR), and Insight-Oriented  Diagnosis:   ICD-10-CM   1. PTSD (post-traumatic stress disorder)  F43.10       Plan: Patient is to use CBT and coping skills to decrease triggered responses.  Patient is to work on breaking things down into smaller pieces.  Patient is to follow plan from session to to communicate plan discussed as well as differences in female-female  brain with her husband. Patient is to work on journaling things that seem to be triggered so that can be addressed in future sessions..  Patient is to continue working with providers on medical issues. Long-term goal: Develop and implement effective coping skills to carry out normal responsibilities and participate constructively in relationships Short-term goal: Practice implement relaxation training as a coping mechanism for tension panic stress anger and anxiety-decrease intrusive thoughts by 50%   Address issues of fear of abandonment and changes in identity at next session.    Lina Sayre, [facility]

## 2022-04-03 ENCOUNTER — Ambulatory Visit: Payer: Self-pay | Admitting: Psychiatry

## 2022-04-17 ENCOUNTER — Ambulatory Visit: Payer: Self-pay | Admitting: Psychiatry

## 2022-04-24 ENCOUNTER — Ambulatory Visit (INDEPENDENT_AMBULATORY_CARE_PROVIDER_SITE_OTHER): Payer: Self-pay | Admitting: Psychiatry

## 2022-04-24 DIAGNOSIS — F4310 Post-traumatic stress disorder, unspecified: Secondary | ICD-10-CM

## 2022-04-24 NOTE — Progress Notes (Signed)
      [facility] Counselor/Therapist Progress Note  Patient ID: MANREET KIERNAN, MRN: [id number],    Date: 04/24/2022  Time Spent: 50 minutes start time 11:06 AM end time 11:56 AM  Treatment Type: Individual Therapy  Reported Symptoms: anxiety, sadness, triggered responses, medical issues  Mental Status Exam:  Appearance:   Well Groomed     Behavior:  Appropriate  Motor:  Normal  Speech/Language:   Normal Rate  Affect:  Appropriate  Mood:  anxious  Thought process:  normal  Thought content:    WNL  Sensory/Perceptual disturbances:    WNL  Orientation:  oriented to person, place, time/date, and situation  Attention:  Good  Concentration:  Good  Memory:  WNL  Fund of knowledge:   Good  Insight:    Good  Judgment:   Good  Impulse Control:  Good   Risk Assessment: Danger to Self:  No Self-injurious Behavior: No Danger to Others: No Duty to Warn:no Physical Aggression / Violence:No  Access to Firearms a concern: No  Gang Involvement:No   Subjective: Patient was present for session.  She is going through transitions with work that are not good. She shared that she is trying to figure out what to do.  She is also looking into other positions as well.  She is also going through medication changes.  Discussed the different options and patient was able to recognize that a new job may be her best options.  She went on to share that she and her husband have had more communication issues.She is recognizing her medical issues have been making it harder on her due to not being able to drive.  Patient did processing set on having to tell her husband she cannot do something, suds level 7, negative cognition "I am a disappointment" felt guilt and anxiety in her chest and stomach.  Patient was able to reduce suds level to 4 through the processing she was able to realize that she has been able to tell people know in the past she seems to have the most problem with her husband because she is  trying to not disappoint him.  Patient was encouraged to allow processing to continue over the next week.  She was encouraged to think through new job options due to the chaos of the one she is currently working.  Patient also shared her husband may have to attend next session.  Interventions: Eye Movement Desensitization and Reprocessing (EMDR)  Diagnosis:   ICD-10-CM   1. PTSD (post-traumatic stress disorder)  F43.10       Plan:  Patient is to use CBT and coping skills to decrease triggered responses.  Patient is to work on breaking things down into smaller pieces.  Patient is to follow plan from session to to communicate plan discussed as well as differences in female-female brain with her husband. Patient is to work on journaling things that seem to be triggered so that can be addressed in future sessions..  Patient is to continue working with providers on medical issues. Long-term goal: Develop and implement effective coping skills to carry out normal responsibilities and participate constructively in relationships Short-term goal: Practice implement relaxation training as a coping mechanism for tension panic stress anger and anxiety-decrease intrusive thoughts by 50%   Address issues of fear of abandonment and changes in identity at next session.    Stevphen Meuse, [facility]

## 2022-05-15 ENCOUNTER — Ambulatory Visit: Payer: Self-pay | Admitting: Psychiatry

## 2022-05-29 ENCOUNTER — Ambulatory Visit (INDEPENDENT_AMBULATORY_CARE_PROVIDER_SITE_OTHER): Payer: Self-pay | Admitting: Psychiatry

## 2022-05-29 DIAGNOSIS — F4310 Post-traumatic stress disorder, unspecified: Secondary | ICD-10-CM

## 2022-05-29 NOTE — Progress Notes (Signed)
      [facility] Counselor/Therapist Progress Note  Patient ID: SHERALD BALBUENA, MRN: [id number],    Date: 05/29/2022  Time Spent: 50 minutes start time 10:57 AM end time 11:47 AM  Treatment Type: Individual Therapy  Reported Symptoms: anxiety, panic, triggered responses, sadness  Mental Status Exam:  Appearance:   Well Groomed     Behavior:  Appropriate  Motor:  Normal  Speech/Language:   Normal Rate  Affect:  Appropriate  Mood:  anxious  Thought process:  normal  Thought content:    WNL  Sensory/Perceptual disturbances:    WNL  Orientation:  oriented to person, place, time/date, and situation  Attention:  Good  Concentration:  Good  Memory:  WNL  Fund of knowledge:   Good  Insight:    Good  Judgment:   Good  Impulse Control:  Good   Risk Assessment: Danger to Self:  No Self-injurious Behavior: No Danger to Others: No Duty to Warn:no Physical Aggression / Violence:No  Access to Firearms a concern: No  Gang Involvement:No   Subjective: Patient was present for session. She shared that she was able to get a new job. She went on to share that she got triggered by a friend of hers that she had gone on a cruise with.  Patient discussed the situation and how she was able to resolve it for herself.  Patient stated that the biggest issue she has currently is her husband got upset on Sunday because he felt that she was very relaxed and calm when she got back from the cruise but then got back into getting triggered and responded differently.  Patient was allowed time to think through what happened the situation.  She was able to realize that when she first came back home she was feeling connected to her husband and they were working together on things but at that moment she felt very alone and that was triggering for her because that is what she felt was a little girl.  Discussed different ways that she could communicate that with him and the importance of being able to say what she  needs even if he has difficulty hearing it.  Wrote out what she wanted to share with her husband and how she felt about different situations on an index card so she could take it home and discuss it with him further.  Interventions: Assertiveness/Communication and Solution-Oriented/Positive Psychology  Diagnosis:   ICD-10-CM   1. PTSD (post-traumatic stress disorder)  F43.10       Plan: Patient is to use CBT and coping skills to decrease triggered responses.  Patient is to share index card that was discussed in session on her different feelings and concerns with her husband. Patient is to work on journaling things that seem to be triggered so that can be addressed in future sessions..  Patient is to continue working with providers on medical issues. Long-term goal: Develop and implement effective coping skills to carry out normal responsibilities and participate constructively in relationships Short-term goal: Practice implement relaxation training as a coping mechanism for tension panic stress anger and anxiety-decrease intrusive thoughts by 50%   Address issues of fear of abandonment and changes in identity at next session.  Stevphen Meuse, [facility]

## 2022-06-19 ENCOUNTER — Ambulatory Visit: Payer: Self-pay | Admitting: Psychiatry

## 2022-07-04 ENCOUNTER — Ambulatory Visit (INDEPENDENT_AMBULATORY_CARE_PROVIDER_SITE_OTHER): Payer: Self-pay | Admitting: Psychiatry

## 2022-07-12 ENCOUNTER — Ambulatory Visit (INDEPENDENT_AMBULATORY_CARE_PROVIDER_SITE_OTHER): Payer: Self-pay | Admitting: Psychiatry

## 2022-07-12 DIAGNOSIS — F4310 Post-traumatic stress disorder, unspecified: Secondary | ICD-10-CM

## 2022-07-12 NOTE — Progress Notes (Signed)
[facility] Counselor/Therapist Progress Note  Patient ID: Amber Taylor, MRN: [id number],    Date:07/12/2022  Time Spent: 45 minutes start time 9:15 AM end time 10 AM Virtual Visit via Video Note Connected with patient by a telemedicine/telehealth application, with their informed consent, and verified patient privacy and that I am speaking with the correct person using two identifiers. I discussed the limitations, risks, security and privacy concerns of performing psychotherapy and the availability of in person appointments. I also discussed with the patient that there may be a patient responsible charge related to this service. The patient expressed understanding and agreed to proceed. I discussed the treatment planning with the patient. The patient was provided an opportunity to ask questions and all were answered. The patient agreed with the plan and demonstrated an understanding of the instructions. The patient was advised to call  our office if  symptoms worsen or feel they are in a crisis state and need immediate contact.   Therapist Location: office Patient Location: home    Treatment Type: Individual Therapy  Reported Symptoms: anxiety, sleep issues, triggered responses  Mental Status Exam:  Appearance:   Casual     Behavior:  Appropriate  Motor:  Normal  Speech/Language:   Normal Rate  Affect:  Appropriate  Mood:  anxious  Thought process:  normal  Thought content:    WNL  Sensory/Perceptual disturbances:    WNL  Orientation:  oriented to person, place, time/date, and situation  Attention:  Good  Concentration:  Good  Memory:  WNL  Fund of knowledge:   Good  Insight:    Good  Judgment:   Good  Impulse Control:  Good   Risk Assessment: Danger to Self:  No Self-injurious Behavior: No Danger to Others: No Duty to Warn:no Physical Aggression / Violence:No  Access to Firearms a concern: No  Gang Involvement:No   Subjective: Met with patient via virtual  session. She shared she is doing well at her new job and trying to wrap up on her old job. She shared that she has been triggered through the process.  She shared that she has had anxiety about getting the kids out on the early days. She shared that her husband has been helping more and that has been a positive.  Patient went on to discuss the fact that her husband is feeling that she is not doing some of the things that he needs her to do do for him.  Patient was encouraged to discuss what she was thinking and her husband were thinking.  Patient was encouraged to recognize it seems to be that there is a disconnect between what her husband is needing her to do and what may be possible with her situation.  Encouraged patient to think through the situation and try and find what things that can help send him the message that he is heard at the same time realistically be able to be accomplished.  Patient was able to come up with some things that she can do to help her husband feel valued and heard at the same time that would be realistic for her with her situation.  Patient was encouraged to continue trying to find a balance between what others may want from her and what is realistic for her to accomplish and to know where to ask for help and how to set limits.  Interventions: Solution-Oriented/Positive Psychology and Insight-Oriented  Diagnosis:   ICD-10-CM   1. PTSD (post-traumatic stress disorder)  F43.10       Plan:  Patient is to use CBT and coping skills to decrease triggered responses.  Patient is to share feelings and concerns with her husband. Patient is to work on journaling things that seem to be triggered so that can be addressed in future sessions..  Patient is to continue working with providers on medical issues. Long-term goal: Develop and implement effective coping skills to carry out normal responsibilities and participate constructively in relationships Short-term goal: Practice implement  relaxation training as a coping mechanism for tension panic stress anger and anxiety-decrease intrusive thoughts by 50%   Address issues of fear of abandonment and changes in identity at next session.  Lina Sayre, [facility]

## 2022-08-01 ENCOUNTER — Ambulatory Visit (INDEPENDENT_AMBULATORY_CARE_PROVIDER_SITE_OTHER): Payer: Self-pay | Admitting: Psychiatry

## 2022-08-01 DIAGNOSIS — F4310 Post-traumatic stress disorder, unspecified: Secondary | ICD-10-CM

## 2022-08-01 NOTE — Progress Notes (Signed)
[facility] Counselor/Therapist Progress Note  Patient ID: Amber Taylor, MRN: [id number],    Date: 08/01/2022  Time Spent: 50 minutes start time 2:10 PM Virtual Visit via Video Note Connected with patient by a telemedicine/telehealth application, with their informed consent, and verified patient privacy and that I am speaking with the correct person using two identifiers. I discussed the limitations, risks, security and privacy concerns of performing psychotherapy and the availability of in person appointments. I also discussed with the patient that there may be a patient responsible charge related to this service. The patient expressed understanding and agreed to proceed. I discussed the treatment planning with the patient. The patient was provided an opportunity to ask questions and all were answered. The patient agreed with the plan and demonstrated an understanding of the instructions. The patient was advised to call  our office if  symptoms worsen or feel they are in a crisis state and need immediate contact.   Therapist Location: office Patient Location: home    Treatment Type: Individual Therapy  Reported Symptoms: anxiety, sadness, triggered responses  Mental Status Exam:  Appearance:   Well Groomed     Behavior:  Appropriate  Motor:  Normal  Speech/Language:   Normal Rate  Affect:  Appropriate  Mood:  anxious  Thought process:  normal  Thought content:    WNL  Sensory/Perceptual disturbances:    WNL  Orientation:  oriented to person, place, time/date, and situation  Attention:  Good  Concentration:  Good  Memory:  WNL  Fund of knowledge:   Good  Insight:    Good  Judgment:   Good  Impulse Control:  Good   Risk Assessment: Danger to Self:  No Self-injurious Behavior: No Danger to Others: No Duty to Warn:no Physical Aggression / Violence:No  Access to Firearms a concern: No  Gang Involvement:No   Subjective: Met with patient via virtue session.  Patient  reported that she had had a very stressful few weeks at work.  She explained some of the dynamics of the new job.  She was able to recognize that her supervisor was much like her mother.  When she was able to recognize that.  Patient stated she was able to have perspective and manage things more appropriately.  Discussed the importance of reminding herself of that perspective and being able to maintain.  Patient went on to share that she feels that things are going better at home overall but she is feeling that she gets overwhelmed and is having a hard time connecting when she gets back home after a busy day at work.  Discussed the importance of her giving herself time to go from work brain to [employer] brain and to recognize that that is probably normal.  Develop some plans with her to help herself as well as her children while she is trying to transition back into what they need from what her work place needs.  Patient reported feeling that that was very helpful and that gave her away to have perspective that she can work on and practice over the next few weeks.  Patient also shared plans from last session had been helpful with her husband and they had been able to resolve some things that she felt positive about how she had handled situations.  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  Interventions: Solution-Oriented/Positive Psychology  Diagnosis:   ICD-10-CM   1. PTSD (post-traumatic stress disorder)  F43.10       Plan: Patient is to use CBT and coping skills to decrease triggered responses.  Patient is to work on giving herself opportunity to her and transition from work to home mentally so that she can feel more present with her children.. Patient is to work on journaling things that  seem to be triggered so that can be addressed in future sessions..  Patient is to continue working with providers on medical issues. Long-term goal: Develop and implement effective coping skills to carry out normal responsibilities and participate constructively in relationships Short-term goal: Practice implement relaxation training as a coping mechanism for tension panic stress anger and anxiety-decrease intrusive thoughts by 50%   Address issues of fear of abandonment and changes in identity at next session.  Lina Sayre, [facility]

## 2022-08-15 ENCOUNTER — Ambulatory Visit: Payer: Self-pay | Admitting: Psychiatry

## 2022-11-26 ENCOUNTER — Ambulatory Visit (INDEPENDENT_AMBULATORY_CARE_PROVIDER_SITE_OTHER): Payer: Self-pay | Admitting: Psychiatry

## 2022-11-26 DIAGNOSIS — F4310 Post-traumatic stress disorder, unspecified: Secondary | ICD-10-CM

## 2022-11-26 NOTE — Progress Notes (Unsigned)
[facility] Counselor/Therapist Progress Note  Patient ID: JODECI MICHELE, MRN: [id number],    Date: 11/26/2022  Time Spent: 50 minutes start time 4:59 PM end time 12:49 PM Virtual Visit via Video Note Connected with patient by a telemedicine/telehealth application, with their informed consent, and verified patient privacy and that I am speaking with the correct person using two identifiers. I discussed the limitations, risks, security and privacy concerns of performing psychotherapy and the availability of in person appointments. I also discussed with the patient that there may be a patient responsible charge related to this service. The patient expressed understanding and agreed to proceed. I discussed the treatment planning with the patient. The patient was provided an opportunity to ask questions and all were answered. The patient agreed with the plan and demonstrated an understanding of the instructions. The patient was advised to call  our office if  symptoms worsen or feel they are in a crisis state and need immediate contact.   Therapist Location: home Patient Location: home    Treatment Type: Individual Therapy  Reported Symptoms: anxiety, triggered responses, fatigue, sadness  Mental Status Exam:  Appearance:   Well Groomed     Behavior:  Appropriate  Motor:  Normal  Speech/Language:   Normal Rate  Affect:  Appropriate  Mood:  anxious  Thought process:  normal  Thought content:    WNL  Sensory/Perceptual disturbances:    WNL  Orientation:  oriented to person, place, time/date, and situation  Attention:  Good  Concentration:  Good  Memory:  WNL  Fund of knowledge:   Good  Insight:    Good  Judgment:   Good  Impulse Control:  Good   Risk Assessment: Danger to Self:  No Self-injurious Behavior: No Danger to Others: No Duty to Warn:no Physical Aggression / Violence:No  Access to Firearms a concern: No  Gang Involvement:No   Subjective: Met with patient via  virtual session. She shared that things have been very stressful at work. She shared she had to fight her way through the drama and is doing better. She shared she is only working 1 job currently. She shared that she realized the old job was very triggering for her because they were so much like her mother.  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 Things are still hard with her husband. She did start a virtual Bible study that has been encouraging for her.  Patient discussed some of the different dynamics with her husband.  She shared that she gets very upset over some things that he said and she wants to make sure she is not overreacting and creating more issues.  Patient was able to get some different situations.  Discussed the fact that her husband says very female statements that does not give her all the details and facts to help her understand what he needs.  Patient was able to recognize that she has to have a filter that her husband does love her and does not want to hurt her so when he says things in a way that is triggering for her she does not get so upset.  Discussed the importance of recognizing when she is getting triggered and starting to figure out what age is being triggered.  Patient is also to work on feeling when her body is starting to get upset taking some deep breaths reminding herself of the truth and then asking  her husband to explain what he is saying or tell him that this is what she is hearing. Is not the truth to make sure she just does not respond out of emotion.  Interventions: Solution-Oriented/Positive Psychology and Insight-Oriented  Diagnosis:   ICD-10-CM   1. PTSD (post-traumatic stress disorder)  F43.10       Plan:  Patient is to use CBT and coping skills to decrease triggered responses.  Patient is to work on recognizing when she is getting triggered and figure out what age is being triggered as well as working on  grounding herself taking some deep breaths and then asking husband to clarify what he meant before she just responds. Patient is to work on journaling things that seem to be triggered so that can be addressed in future sessions..  Patient is to continue working with providers on medical issues. Long-term goal: Develop and implement effective coping skills to carry out normal responsibilities and participate constructively in relationships Short-term goal: Practice implement relaxation training as a coping mechanism for tension panic stress anger and anxiety-decrease intrusive thoughts by 50%   Address issues of fear of abandonment and changes in identity at next session.  Stevphen Meuse, [facility]

## 2022-12-31 ENCOUNTER — Ambulatory Visit (INDEPENDENT_AMBULATORY_CARE_PROVIDER_SITE_OTHER): Payer: Self-pay | Admitting: Psychiatry

## 2022-12-31 DIAGNOSIS — F4310 Post-traumatic stress disorder, unspecified: Secondary | ICD-10-CM

## 2022-12-31 NOTE — Progress Notes (Unsigned)
[facility] Counselor/Therapist Progress Note  Patient ID: MALLERY HARSHMAN, MRN: [id number],    Date: 12/31/2022  Time Spent: 50 minutes start time 4:01 PM end time 4:51 PM Virtual Visit via video Note Connected with patient by a telemedicine/telehealth application, with their informed consent, and verified patient privacy and that I am speaking with the correct person using two identifiers. I discussed the limitations, risks, security and privacy concerns of performing psychotherapy and the availability of in person appointments. I also discussed with the patient that there may be a patient responsible charge related to this service. The patient expressed understanding and agreed to proceed. I discussed the treatment planning with the patient. The patient was provided an opportunity to ask questions and all were answered. The patient agreed with the plan and demonstrated an understanding of the instructions. The patient was advised to call  our office if  symptoms worsen or feel they are in a crisis state and need immediate contact.   Therapist Location: home Patient Location: home    Treatment Type: Individual Therapy  Reported Symptoms: anxiety, triggered responses, sadness  Mental Status Exam:  Appearance:   Casual     Behavior:  Appropriate  Motor:  Normal  Speech/Language:   Normal Rate  Affect:  Appropriate  Mood:  anxious  Thought process:  normal  Thought content:    WNL  Sensory/Perceptual disturbances:    WNL  Orientation:  oriented to person, place, time/date, and situation  Attention:  Good  Concentration:  Good  Memory:  WNL  Fund of knowledge:   Good  Insight:    Good  Judgment:   Good  Impulse Control:  Good   Risk Assessment: Danger to Self:  No Self-injurious Behavior: No Danger to Others: No Duty to Warn:no Physical Aggression / Violence:No  Access to Firearms a concern: No  Gang Involvement:No   Subjective: Met with patient via virtual session.  She shared she was sick, had to let her nanny go, had a friend die, and had a miscarriage.  She shared that having challenges with her children discussed what is normal and encouraged her to look at [employer] books for help and guidance. She went on to share that she had been thinking of the age that gets triggered and as she has done that other memories have surfaced. Discussed perspective and the importance of having the right perspective to deal with life so that she can get something accomplished when it is not realistic to get it all completed.  Agreed that processing on that part of her could be completed in future sessions.  The patient also is trying to figure out job situation and encouraged her to recognize what is realistic and what is not realistic and be able to discuss those things with her husband.  Interventions: Cognitive Behavioral Therapy, Solution-Oriented/Positive Psychology, and Insight-Oriented  Diagnosis:   ICD-10-CM   1. PTSD (post-traumatic stress disorder)  F43.10       Plan: Patient is to use CBT and coping skills to decrease triggered responses.  Patient is to work on trying to have realistic expectations of herself and being able to set limits and think through what she wants for her future. Patient is to work on journaling things that seem to be triggered so that can be addressed in future sessions..  Patient is to continue working with providers on medical issues. Long-term goal: Develop and implement effective coping skills to carry out normal responsibilities and participate  constructively in relationships Short-term goal: Practice implement relaxation training as a coping mechanism for tension panic stress anger and anxiety-decrease intrusive thoughts by 50%   Address issues of fear of abandonment and changes in identity at next session.  Stevphen Meuse, [facility]

## 2023-01-15 ENCOUNTER — Ambulatory Visit (INDEPENDENT_AMBULATORY_CARE_PROVIDER_SITE_OTHER): Payer: Self-pay | Admitting: Psychiatry

## 2023-01-15 DIAGNOSIS — F4310 Post-traumatic stress disorder, unspecified: Secondary | ICD-10-CM

## 2023-01-15 NOTE — Progress Notes (Unsigned)
[facility] Counselor/Therapist Progress Note  Patient ID: Amber Taylor, MRN: [id number],    Date: 01/15/2023  Time Spent: 52 minutes start time 1:07 PM end time 1:59 PM Virtual Visit via Video Note Connected with patient by a telemedicine/telehealth application, with their informed consent, and verified patient privacy and that I am speaking with the correct person using two identifiers. I discussed the limitations, risks, security and privacy concerns of performing psychotherapy and the availability of in person appointments. I also discussed with the patient that there may be a patient responsible charge related to this service. The patient expressed understanding and agreed to proceed. I discussed the treatment planning with the patient. The patient was provided an opportunity to ask questions and all were answered. The patient agreed with the plan and demonstrated an understanding of the instructions. The patient was advised to call  our office if  symptoms worsen or feel they are in a crisis state and need immediate contact.   Therapist Location: home Patient Location: home    Treatment Type: Individual Therapy  Reported Symptoms: Anxiety, health issues, triggered responses, rumination  Mental Status Exam:  Appearance:   Casual     Behavior:  Appropriate  Motor:  Normal  Speech/Language:   Normal Rate  Affect:  Appropriate  Mood:  anxious  Thought process:  normal  Thought content:    WNL  Sensory/Perceptual disturbances:    WNL  Orientation:  oriented to person, place, time/date, and situation  Attention:  Good  Concentration:  Good  Memory:  WNL  Fund of knowledge:   Good  Insight:    Good  Judgment:   Good  Impulse Control:  Good   Risk Assessment: Danger to Self:  No Self-injurious Behavior: No Danger to Others: No Duty to Warn:no Physical Aggression / Violence:No  Access to Firearms a concern: No  Gang Involvement:No   Subjective: Met with patient via  virtual session. She shared she is still sick after 3 weeks.  She went on to share that last session was helpful and she has been able to think through what she needs to do when she gets overwhelmed. She shared that she is still trying to figure out what to do with her work situation. Helped her think through what and how she needs to discus the issue with her husband.  Patient was able to realize she can only do so much especially with her seizure disorder she have to work on her self-care and make sure she is doing what she needs to do so that nothing negative happen.  Patient was also able to recognize that she needs to make sure things are okay for her children and that they are moving in a positive direction as well.  Patient was encouraged to remind herself of the tree and to stay focused on what she is accomplishing and to recognize she has to take things one step at a time.  Wife to continue affirming the younger part of her that surfaces when she gets overwhelmed or discussed with patient.  Interventions: Solution-Oriented/Positive Psychology and Insight-Oriented  Diagnosis:   ICD-10-CM   1. PTSD (post-traumatic stress disorder)  F43.10       Plan:  Patient is to use CBT and coping skills to decrease triggered responses.  Patient to develop plans from session to communicate with her husband about feeling overwhelmed and what she needs to see happen to make things more manageable.  Patient is to work on  trying to have realistic expectations of herself and being able to set limits and think through what she wants for her future. Patient is to work on journaling things that seem to be triggered so that can be addressed in future sessions..  Patient is to continue working with providers on medical issues.  Long-term goal: Develop and implement effective coping skills to carry out normal responsibilities and participate constructively in relationships Short-term goal: Practice implement relaxation  training as a coping mechanism for tension panic stress anger and anxiety-decrease intrusive thoughts by 50%  Stevphen Meuse, [facility]

## 2023-01-17 ENCOUNTER — Ambulatory Visit: Payer: Self-pay | Admitting: Psychiatry

## 2023-02-07 ENCOUNTER — Ambulatory Visit (INDEPENDENT_AMBULATORY_CARE_PROVIDER_SITE_OTHER): Payer: Self-pay | Admitting: Psychiatry

## 2023-02-07 DIAGNOSIS — F4310 Post-traumatic stress disorder, unspecified: Secondary | ICD-10-CM

## 2023-02-07 NOTE — Progress Notes (Signed)
[facility] Counselor/Therapist Progress Note  Patient ID: Amber Taylor, MRN: [id number],    Date: 02/07/2023  Time Spent: 51 minutes start time 9:07 AM end time 9:58 AM Virtual Visit via Video Note Connected with patient by a telemedicine/telehealth application, with their informed consent, and verified patient privacy and that I am speaking with the correct person using two identifiers. I discussed the limitations, risks, security and privacy concerns of performing psychotherapy and the availability of in person appointments. I also discussed with the patient that there may be a patient responsible charge related to this service. The patient expressed understanding and agreed to proceed. I discussed the treatment planning with the patient. The patient was provided an opportunity to ask questions and all were answered. The patient agreed with the plan and demonstrated an understanding of the instructions. The patient was advised to call  our office if  symptoms worsen or feel they are in a crisis state and need immediate contact.   Therapist Location: office Patient Location: home    Treatment Type: Individual Therapy  Reported Symptoms: anxiety, health issues, triggered responses, nightmares  Mental Status Exam:  Appearance:   Casual     Behavior:  Appropriate  Motor:  Normal  Speech/Language:   Normal Rate  Affect:  Appropriate  Mood:  anxious  Thought process:  normal  Thought content:    WNL  Sensory/Perceptual disturbances:    WNL  Orientation:  oriented to person, place, time/date, and situation  Attention:  Good  Concentration:  Good  Memory:  WNL  Fund of knowledge:   Good  Insight:    Good  Judgment:   Good  Impulse Control:  Good   Risk Assessment: Danger to Self:  No Self-injurious Behavior: No Danger to Others: No Duty to Warn:no Physical Aggression / Violence:No  Access to Firearms a concern: No  Gang Involvement:No   Subjective: Met with patient  via virtual session. She shared that she is no longer working and she was sick for 2 weeks and realized she can't physically handle the work.  She went on to share she is realizing some unhealthy patterns in relationships. Worked on reframing them and staying focused on what she can do and importance of recognizing that she doesn't have to fix them.  Patient was encouraged to start sending positive messages to her husband throughout the day and to have him send them to her.  Discussed how to communicate what she made with him so that they can continue behaving in a positive direction.  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  Interventions: Cognitive Behavioral Therapy and Solution-Oriented/Positive Psychology  Diagnosis:   ICD-10-CM   1. PTSD (post-traumatic stress disorder)  F43.10       Plan:  Patient is to use CBT and coping skills to decrease triggered responses.  Patient to develop plans from session to communicate positive affirmations throughout the day which she and her husband..  Patient is to work on trying to have realistic expectations of herself and being able to set limits and think through what she wants for her future. Patient is to work on journaling things that seem to be triggered so that can be addressed in future sessions..  Patient is to continue working with providers on medical issues.  Long-term goal: Develop and implement effective coping skills to  carry out normal responsibilities and participate constructively in relationships Short-term goal: Practice implement relaxation training as a coping mechanism for tension panic stress anger and anxiety-decrease intrusive thoughts by 50%    Stevphen Meuse, [facility]

## 2023-04-02 ENCOUNTER — Ambulatory Visit (INDEPENDENT_AMBULATORY_CARE_PROVIDER_SITE_OTHER): Payer: Self-pay | Admitting: Psychiatry

## 2023-04-02 DIAGNOSIS — F4310 Post-traumatic stress disorder, unspecified: Secondary | ICD-10-CM

## 2023-04-02 NOTE — Progress Notes (Signed)
[facility] Counselor/Therapist Progress Note  Patient ID: Amber Taylor, MRN: [id number],    Date: 04/02/2023  Time Spent: 52 minutes start time 9:03 AM end time 9:55 AM Virtual Visit via Video Note Connected with patient by a telemedicine/telehealth application, with their informed consent, and verified patient privacy and that I am speaking with the correct person using two identifiers. I discussed the limitations, risks, security and privacy concerns of performing psychotherapy and the availability of in person appointments. I also discussed with the patient that there may be a patient responsible charge related to this service. The patient expressed understanding and agreed to proceed. I discussed the treatment planning with the patient. The patient was provided an opportunity to ask questions and all were answered. The patient agreed with the plan and demonstrated an understanding of the instructions. The patient was advised to call  our office if  symptoms worsen or feel they are in a crisis state and need immediate contact.   Therapist Location: home Patient Location: dad's home    Treatment Type: Individual Therapy  Reported Symptoms: anxiety, sadness, triggered responses, flashbacks  Mental Status Exam:  Appearance:   Well Groomed     Behavior:  Appropriate  Motor:  Normal  Speech/Language:   Normal Rate  Affect:  Appropriate  Mood:  anxious  Thought process:  normal  Thought content:    WNL  Sensory/Perceptual disturbances:    WNL  Orientation:  oriented to person, place, time/date, and situation  Attention:  Good  Concentration:  Good  Memory:  WNL  Fund of knowledge:   Good  Insight:    Good  Judgment:   Good  Impulse Control:  Good   Risk Assessment: Danger to Self:  No Self-injurious Behavior: No Danger to Others: No Duty to Warn:no Physical Aggression / Violence:No  Access to Firearms a concern: No  Gang Involvement:No   Subjective: Met with  patient via virtual session. She shared that she has noticed that since his grandmother died her husband has declined emotionally.  She went on to share that there have been different things that have happened that have triggered flashbacks for her recently. She shared she is trying to get her physical limitations under control and make sure she is not having any seizures or passing out so she can get back to driving and feel hours of the independence that she would like.  Patient was allowed time just to share some of the issues that she is feeling and different ways to try and talk herself through moments and her relationships were addressed with patient.  Patient was encouraged to focus on the things that she can control fix and change and to start listening to podcast by Dr. Casilda Carls.  Patient was also to work on taking more time at her dads if she needs to have more support and help as she works on her medical issues.  Interventions: Solution-Oriented/Positive Psychology and Insight-Oriented  Diagnosis:   ICD-10-CM   1. PTSD (post-traumatic stress disorder)  F43.10       Plan:  Patient is to use CBT and coping skills to decrease triggered responses.  Patient to develop plans from session to communicate positive affirmations throughout the day which she and her husband..  Patient is to work on trying to have realistic expectations of herself and being able to set limits and think through what she wants for her future. Patient is to work on journaling things that seem to  be triggered so that can be addressed in future sessions..  Patient is to continue working with providers on medical issues.  Long-term goal: Develop and implement effective coping skills to carry out normal responsibilities and participate constructively in relationships Short-term goal: Practice implement relaxation training as a coping mechanism for tension panic stress anger and anxiety-decrease intrusive thoughts by 50%       Stevphen Meuse, [facility]

## 2023-04-17 ENCOUNTER — Ambulatory Visit (INDEPENDENT_AMBULATORY_CARE_PROVIDER_SITE_OTHER): Payer: Self-pay | Admitting: Psychiatry

## 2023-04-17 DIAGNOSIS — F4310 Post-traumatic stress disorder, unspecified: Secondary | ICD-10-CM
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
## 2023-09-02 ENCOUNTER — Ambulatory Visit: Payer: Self-pay | Admitting: Psychiatry

## 2023-09-02 DIAGNOSIS — F4310 Post-traumatic stress disorder, unspecified: Secondary | ICD-10-CM
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
## 2023-09-24 ENCOUNTER — Ambulatory Visit (INDEPENDENT_AMBULATORY_CARE_PROVIDER_SITE_OTHER): Payer: Self-pay | Admitting: Psychiatry

## 2023-09-24 DIAGNOSIS — F4310 Post-traumatic stress disorder, unspecified: Secondary | ICD-10-CM
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
